# Patient Record
Sex: Male | Born: 1950 | Race: Black or African American | Hispanic: No | Marital: Single | Smoking: Current some day smoker
Health system: Southern US, Community
[De-identification: ages and names within clinical notes are randomized; demographics above are authoritative.]

## PROBLEM LIST (undated history)

## (undated) DIAGNOSIS — D352 Benign neoplasm of pituitary gland: Secondary | ICD-10-CM

## (undated) DIAGNOSIS — I10 Essential (primary) hypertension: Secondary | ICD-10-CM

## (undated) DIAGNOSIS — I639 Cerebral infarction, unspecified: Secondary | ICD-10-CM

---
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
## 1968-06-23 HISTORY — PX: ELBOW SURGERY: SHX618

## 2008-01-09 ENCOUNTER — Emergency Department (HOSPITAL_COMMUNITY): Admission: EM | Admit: 2008-01-09 | Discharge: 2008-01-09 | Payer: Self-pay | Admitting: Emergency Medicine

## 2008-11-26 ENCOUNTER — Emergency Department (HOSPITAL_COMMUNITY): Admission: EM | Admit: 2008-11-26 | Discharge: 2008-11-27 | Payer: Self-pay | Admitting: Emergency Medicine

## 2008-11-30 ENCOUNTER — Encounter: Admission: RE | Admit: 2008-11-30 | Discharge: 2008-11-30 | Payer: Self-pay | Admitting: Orthopaedic Surgery

## 2008-12-18 ENCOUNTER — Emergency Department (HOSPITAL_COMMUNITY): Admission: EM | Admit: 2008-12-18 | Discharge: 2008-12-18 | Payer: Self-pay | Admitting: Family Medicine

## 2011-03-21 LAB — POCT I-STAT, CHEM 8
BUN: 16
Calcium, Ion: 1.01 — ABNORMAL LOW
Chloride: 108
Creatinine, Ser: 1.4
Glucose, Bld: 86
HCT: 43
Hemoglobin: 14.6
Potassium: 4.3
Sodium: 138
TCO2: 23

## 2011-03-21 LAB — CBC
HCT: 39.6
Hemoglobin: 13.3
MCHC: 33.7
MCV: 85
Platelets: 208
RBC: 4.66
RDW: 14
WBC: 8.4

## 2011-03-21 LAB — DIFFERENTIAL
Basophils Absolute: 0
Basophils Relative: 0
Eosinophils Absolute: 0
Eosinophils Relative: 0
Lymphocytes Relative: 12
Lymphs Abs: 1
Monocytes Absolute: 0.5
Monocytes Relative: 6
Neutro Abs: 6.9
Neutrophils Relative %: 82 — ABNORMAL HIGH

## 2011-03-21 LAB — URINE MICROSCOPIC-ADD ON

## 2011-03-21 LAB — URINALYSIS, ROUTINE W REFLEX MICROSCOPIC
Bilirubin Urine: NEGATIVE
Glucose, UA: NEGATIVE
Ketones, ur: 15 — AB
Leukocytes, UA: NEGATIVE
Nitrite: NEGATIVE
Protein, ur: NEGATIVE
Specific Gravity, Urine: 1.025
Urobilinogen, UA: 1
pH: 7

## 2014-05-25 DIAGNOSIS — R531 Weakness: Secondary | ICD-10-CM | POA: Diagnosis not present

## 2014-05-25 DIAGNOSIS — I639 Cerebral infarction, unspecified: Secondary | ICD-10-CM | POA: Diagnosis not present

## 2014-05-25 DIAGNOSIS — I10 Essential (primary) hypertension: Secondary | ICD-10-CM | POA: Diagnosis not present

## 2014-05-25 DIAGNOSIS — I6523 Occlusion and stenosis of bilateral carotid arteries: Secondary | ICD-10-CM | POA: Diagnosis not present

## 2014-05-25 DIAGNOSIS — R200 Anesthesia of skin: Secondary | ICD-10-CM | POA: Diagnosis not present

## 2014-05-25 DIAGNOSIS — I638 Other cerebral infarction: Secondary | ICD-10-CM | POA: Diagnosis not present

## 2014-05-25 DIAGNOSIS — Z720 Tobacco use: Secondary | ICD-10-CM | POA: Diagnosis not present

## 2014-05-25 DIAGNOSIS — R202 Paresthesia of skin: Secondary | ICD-10-CM | POA: Diagnosis not present

## 2014-05-25 LAB — URINALYSIS, COMPLETE
Bacteria: NONE SEEN
Bilirubin,UR: NEGATIVE
Blood: NEGATIVE
Glucose,UR: NEGATIVE mg/dL (ref 0–75)
Ketone: NEGATIVE
Leukocyte Esterase: NEGATIVE
Nitrite: NEGATIVE
Ph: 6 (ref 4.5–8.0)
Protein: NEGATIVE
RBC,UR: 1 /HPF (ref 0–5)
Specific Gravity: 1.023 (ref 1.003–1.030)
Squamous Epithelial: <1
WBC UR: <1 /HPF (ref 0–5)

## 2014-05-25 LAB — COMPREHENSIVE METABOLIC PANEL
Albumin: 3.4 g/dL (ref 3.4–5.0)
Alkaline Phosphatase: 95 U/L
Anion Gap: 7 (ref 7–16)
BUN: 16 mg/dL (ref 7–18)
Bilirubin,Total: 0.5 mg/dL (ref 0.2–1.0)
Calcium, Total: 8.5 mg/dL (ref 8.5–10.1)
Chloride: 108 mmol/L — ABNORMAL HIGH (ref 98–107)
Co2: 28 mmol/L (ref 21–32)
Creatinine: 1.15 mg/dL (ref 0.60–1.30)
EGFR (African American): >60
EGFR (Non-African Amer.): >60
Glucose: 107 mg/dL — ABNORMAL HIGH (ref 65–99)
Osmolality: 287 (ref 275–301)
Potassium: 4.3 mmol/L (ref 3.5–5.1)
SGOT(AST): 21 U/L (ref 15–37)
SGPT (ALT): 14 U/L
Sodium: 143 mmol/L (ref 136–145)
Total Protein: 7 g/dL (ref 6.4–8.2)

## 2014-05-25 LAB — PROTIME-INR
INR: 1
Prothrombin Time: 13.3 secs (ref 11.5–14.7)

## 2014-05-25 LAB — LIPID PANEL
Cholesterol: 158 mg/dL (ref 0–200)
HDL Cholesterol: 37 mg/dL — ABNORMAL LOW (ref 40–60)
Ldl Cholesterol, Calc: 101 mg/dL — ABNORMAL HIGH (ref 0–100)
Triglycerides: 102 mg/dL (ref 0–200)
VLDL Cholesterol, Calc: 20 mg/dL (ref 5–40)

## 2014-05-25 LAB — CBC
HCT: 42.4 % (ref 40.0–52.0)
HGB: 13.6 g/dL (ref 13.0–18.0)
MCH: 27.4 pg (ref 26.0–34.0)
MCHC: 32.2 g/dL (ref 32.0–36.0)
MCV: 85 fL (ref 80–100)
Platelet: 238 10*3/uL (ref 150–440)
RBC: 4.97 10*6/uL (ref 4.40–5.90)
RDW: 14.5 % (ref 11.5–14.5)
WBC: 5.2 10*3/uL (ref 3.8–10.6)

## 2014-05-25 LAB — APTT: Activated PTT: 33.3 secs (ref 23.6–35.9)

## 2014-05-26 ENCOUNTER — Inpatient Hospital Stay: Payer: Self-pay | Admitting: Internal Medicine

## 2014-05-26 DIAGNOSIS — I63541 Cerebral infarction due to unspecified occlusion or stenosis of right cerebellar artery: Secondary | ICD-10-CM | POA: Diagnosis not present

## 2014-05-26 DIAGNOSIS — Z720 Tobacco use: Secondary | ICD-10-CM | POA: Diagnosis not present

## 2014-05-26 DIAGNOSIS — I6782 Cerebral ischemia: Secondary | ICD-10-CM | POA: Diagnosis not present

## 2014-05-26 DIAGNOSIS — I638 Other cerebral infarction: Secondary | ICD-10-CM | POA: Diagnosis not present

## 2014-05-26 DIAGNOSIS — R739 Hyperglycemia, unspecified: Secondary | ICD-10-CM | POA: Diagnosis present

## 2014-05-26 DIAGNOSIS — I63542 Cerebral infarction due to unspecified occlusion or stenosis of left cerebellar artery: Secondary | ICD-10-CM | POA: Diagnosis not present

## 2014-05-26 DIAGNOSIS — F17210 Nicotine dependence, cigarettes, uncomplicated: Secondary | ICD-10-CM | POA: Diagnosis present

## 2014-05-26 DIAGNOSIS — G8194 Hemiplegia, unspecified affecting left nondominant side: Secondary | ICD-10-CM | POA: Diagnosis present

## 2014-05-26 DIAGNOSIS — I639 Cerebral infarction, unspecified: Secondary | ICD-10-CM | POA: Diagnosis present

## 2014-05-26 DIAGNOSIS — Z9114 Patient's other noncompliance with medication regimen: Secondary | ICD-10-CM | POA: Diagnosis present

## 2014-05-26 DIAGNOSIS — Z716 Tobacco abuse counseling: Secondary | ICD-10-CM | POA: Diagnosis not present

## 2014-05-26 DIAGNOSIS — I10 Essential (primary) hypertension: Secondary | ICD-10-CM | POA: Diagnosis not present

## 2014-05-26 DIAGNOSIS — R531 Weakness: Secondary | ICD-10-CM | POA: Diagnosis not present

## 2014-05-26 DIAGNOSIS — E785 Hyperlipidemia, unspecified: Secondary | ICD-10-CM | POA: Diagnosis not present

## 2014-05-26 LAB — HEMOGLOBIN A1C: Hemoglobin A1C: 6.2 % (ref 4.2–6.3)

## 2014-05-27 DIAGNOSIS — I638 Other cerebral infarction: Secondary | ICD-10-CM | POA: Diagnosis not present

## 2014-05-27 DIAGNOSIS — E785 Hyperlipidemia, unspecified: Secondary | ICD-10-CM | POA: Diagnosis not present

## 2014-05-27 DIAGNOSIS — I10 Essential (primary) hypertension: Secondary | ICD-10-CM | POA: Diagnosis not present

## 2014-05-27 DIAGNOSIS — Z716 Tobacco abuse counseling: Secondary | ICD-10-CM | POA: Diagnosis not present

## 2014-05-28 DIAGNOSIS — I638 Other cerebral infarction: Secondary | ICD-10-CM | POA: Diagnosis not present

## 2014-05-28 DIAGNOSIS — E785 Hyperlipidemia, unspecified: Secondary | ICD-10-CM | POA: Diagnosis not present

## 2014-05-28 DIAGNOSIS — I10 Essential (primary) hypertension: Secondary | ICD-10-CM | POA: Diagnosis not present

## 2014-05-28 DIAGNOSIS — Z716 Tobacco abuse counseling: Secondary | ICD-10-CM | POA: Diagnosis not present

## 2014-05-31 DIAGNOSIS — G8194 Hemiplegia, unspecified affecting left nondominant side: Secondary | ICD-10-CM | POA: Diagnosis not present

## 2014-05-31 DIAGNOSIS — I10 Essential (primary) hypertension: Secondary | ICD-10-CM | POA: Diagnosis not present

## 2014-05-31 DIAGNOSIS — E782 Mixed hyperlipidemia: Secondary | ICD-10-CM | POA: Diagnosis not present

## 2014-05-31 DIAGNOSIS — I63411 Cerebral infarction due to embolism of right middle cerebral artery: Secondary | ICD-10-CM | POA: Diagnosis not present

## 2014-07-10 DIAGNOSIS — I10 Essential (primary) hypertension: Secondary | ICD-10-CM | POA: Diagnosis not present

## 2014-07-10 DIAGNOSIS — G8194 Hemiplegia, unspecified affecting left nondominant side: Secondary | ICD-10-CM | POA: Diagnosis not present

## 2014-07-10 DIAGNOSIS — E782 Mixed hyperlipidemia: Secondary | ICD-10-CM | POA: Diagnosis not present

## 2014-07-10 DIAGNOSIS — D497 Neoplasm of unspecified behavior of endocrine glands and other parts of nervous system: Secondary | ICD-10-CM | POA: Diagnosis not present

## 2014-07-11 ENCOUNTER — Other Ambulatory Visit: Payer: Self-pay | Admitting: Internal Medicine

## 2014-07-11 DIAGNOSIS — I10 Essential (primary) hypertension: Secondary | ICD-10-CM

## 2014-07-19 DIAGNOSIS — I69354 Hemiplegia and hemiparesis following cerebral infarction affecting left non-dominant side: Secondary | ICD-10-CM | POA: Diagnosis not present

## 2014-07-19 DIAGNOSIS — R278 Other lack of coordination: Secondary | ICD-10-CM | POA: Diagnosis not present

## 2014-07-19 DIAGNOSIS — I6789 Other cerebrovascular disease: Secondary | ICD-10-CM | POA: Diagnosis not present

## 2014-07-19 DIAGNOSIS — M6281 Muscle weakness (generalized): Secondary | ICD-10-CM | POA: Diagnosis not present

## 2014-07-24 DIAGNOSIS — I6789 Other cerebrovascular disease: Secondary | ICD-10-CM | POA: Diagnosis not present

## 2014-07-24 DIAGNOSIS — I69354 Hemiplegia and hemiparesis following cerebral infarction affecting left non-dominant side: Secondary | ICD-10-CM | POA: Diagnosis not present

## 2014-07-24 DIAGNOSIS — R278 Other lack of coordination: Secondary | ICD-10-CM | POA: Diagnosis not present

## 2014-07-24 DIAGNOSIS — M6281 Muscle weakness (generalized): Secondary | ICD-10-CM | POA: Diagnosis not present

## 2014-07-25 ENCOUNTER — Ambulatory Visit
Admission: RE | Admit: 2014-07-25 | Discharge: 2014-07-25 | Disposition: A | Discharge status: Home / Self Care | Payer: Medicare Other | Source: Ambulatory Visit | Attending: Internal Medicine | Admitting: Internal Medicine

## 2014-07-25 DIAGNOSIS — I10 Essential (primary) hypertension: Secondary | ICD-10-CM

## 2014-07-25 DIAGNOSIS — D352 Benign neoplasm of pituitary gland: Secondary | ICD-10-CM | POA: Diagnosis not present

## 2014-07-25 MED ORDER — GADOBENATE DIMEGLUMINE 529 MG/ML IV SOLN
7.0000 mL | Freq: Once | INTRAVENOUS | Status: AC | PRN
  Administered 2014-07-25: 7 mL via INTRAVENOUS

## 2014-08-10 DIAGNOSIS — G8194 Hemiplegia, unspecified affecting left nondominant side: Secondary | ICD-10-CM | POA: Diagnosis not present

## 2014-08-10 DIAGNOSIS — D497 Neoplasm of unspecified behavior of endocrine glands and other parts of nervous system: Secondary | ICD-10-CM | POA: Diagnosis not present

## 2014-08-10 DIAGNOSIS — E782 Mixed hyperlipidemia: Secondary | ICD-10-CM | POA: Diagnosis not present

## 2014-08-11 DIAGNOSIS — I10 Essential (primary) hypertension: Secondary | ICD-10-CM | POA: Diagnosis not present

## 2014-08-11 DIAGNOSIS — D497 Neoplasm of unspecified behavior of endocrine glands and other parts of nervous system: Secondary | ICD-10-CM | POA: Diagnosis not present

## 2014-08-11 DIAGNOSIS — E237 Disorder of pituitary gland, unspecified: Secondary | ICD-10-CM | POA: Diagnosis not present

## 2014-08-11 DIAGNOSIS — E789 Disorder of lipoprotein metabolism, unspecified: Secondary | ICD-10-CM | POA: Diagnosis not present

## 2014-08-29 DIAGNOSIS — D352 Benign neoplasm of pituitary gland: Secondary | ICD-10-CM | POA: Diagnosis not present

## 2014-09-27 DIAGNOSIS — E237 Disorder of pituitary gland, unspecified: Secondary | ICD-10-CM | POA: Diagnosis not present

## 2014-10-14 NOTE — H&P (Signed)
PATIENT NAME:  MANLY, NESTLE MR#:  [id number] DATE OF BIRTH:  08/01/1950  DATE OF ADMISSION:  05/25/2014  PRIMARY CARE PHYSICIAN: None.  CHIEF COMPLAINT: Left upper and lower extremity weakness for 3 days.   HISTORY OF PRESENT ILLNESS: Mr. Kau is a 64 year old African American gentleman with a past medical history of hypertension, not on any medication since the patient quit taking it on his own, comes to the Emergency Room with left upper and lower extremity weakness for the last 3 days. The patient says he was at work and started noticing that his left hand, he was unable to grip any of the equipment he was working on. He present in the Emergency Room and was found to have malignant hypertension and left upper and lower extremity weakness with possible new CVA. CT head shows old infarct. He is being admitted for further evaluation and management.   During my evaluation, the patient's blood pressure was 207/104.   PAST MEDICAL HISTORY: Hypertension, not taking any medication, quit on his own.   FAMILY HISTORY: Positive for hypertension.   SOCIAL HISTORY: Works at the [employer]. Smokes about a pack a day. Denies alcohol or drug use.   ALLERGIES: No known drug allergies.   REVIEW OF SYSTEMS:  CONSTITUTIONAL: No fever, fatigue, pain, weight loss or weight gain.  EYES: No blurred or double vision. No cataracts or glaucoma.  ENT: No tinnitus, ear pain, postnasal drip.  RESPIRATORY: No cough, wheeze, hemoptysis or dyspnea.  CARDIOVASCULAR: No chest pain, orthopnea, orthopnea, or edema.  GASTROINTESTINAL: No nausea, vomiting, diarrhea, abdominal pain or hematemesis.  GENITOURINARY: No dysuria, hematuria, frequency.  ENDOCRINE: No polyuria, nocturia or thyroid problems.  HEMATOLOGY: No anemia, easy bruising or bleeding.  SKIN: No acne, rash, or lesion.  MUSCULOSKELETAL: No arthritis, cramps, swelling or gout.  NEUROLOGIC: Positive for left upper and lower extremity  weakness, with a weak left hand grip.  PSYCHIATRIC: No anxiety or depression or bipolar disorder.  All other systems reviewed are negative.   PHYSICAL EXAMINATION:  GENERAL: The patient is awake, alert, oriented x 3, not in acute distress.  VITAL SIGNS: Afebrile. Pulse is 61, blood pressure is 207/104, saturation 100% on room air.  HEENT: Atraumatic, normocephalic. Pupils PERLA, EOM intact. Oral mucosa is moist. No facial droop.  NECK: Supple. No JVD. No carotid bruit.  RESPIRATORY: Clear to auscultation bilaterally. No rales, rhonchi, respiratory distress. No labored breathing.  CARDIOVASCULAR: Both the heart sounds are normal. Rate and rhythm are regular. PMI not lateralized. Chest nontender. Good pedal pulses. Good femoral pulses. No lower extremity edema.  ABDOMEN: Soft, benign, nontender. No organomegaly. Positive bowel sounds.  NEUROLOGIC: The patient is right-handed. Cranial appear intact. No facial droop. No nystagmus. The left upper and lower extremity weakness is 3+ to 4/5. The patient has a weak left hand grip. Sensory exam appears normal. Reflexes 1+. Deep tendon jerks are 1+ in both upper and lower extremities. Plantars are downgoing. Gait deferred.  SKIN: Warm and dry.  PSYCHIATRIC: The patient is awake, alert, oriented x 3.   LABORATORY: CBC within normal limits. CT of the head shows extensive periventricular white matter ischemic changes, markedly progressed since 2009. Lacunar infarct in the left thalamus, not acute but new since prior study of 2009. Metabolic panel within normal limits. PT-INR within normal limits.   CHEST X-RAY: No acute cardiopulmonary abnormality.   EKG: Normal sinus rhythm with LVH changes seen on EKG.   ASSESSMENT: Mr. Flemister  is a 64 year old with  history of hypertension, noncompliant with medication, comes in with left-sided weakness, upper and lower extremities, for 2-3 days. He is being admitted with:  1.  Suspected acute cerebrovascular accident.  The patient has significant left hand grip which is weak. CT head does not show acute stroke, but new stroke since 2009. He is going to be admitted on the telemetry floor. We will start him on full dose aspirin. Dr. Leotis Pain  from neurology has seen the patient; will follow Dr. Beryl Meager recommendations. Will get MRI of the brain, echo Doppler, and ultrasound carotid Doppler. Check lipid profile.  2.  Malignant hypertension. I will allow some permissive hypertension in the setting of the patient's symptoms suggestive of stroke. We will give p.r.n. hydralazine if systolic is greater than 811. Start him on low-dose beta blocker, 25 mg b.i.d. The patient is advised on a low sodium diet as well.  3.  Tobacco abuse. The patient was advised on smoking cessation; about 4 minutes spent. He did voice understanding.   The above was discussed with the patient and the patient's wife, who was present in the Emergency Room.   TIME SPENT: 50 minutes.    ____________________________ Hart Rochester Posey Pronto, MD sap:MT D: 05/25/2014 16:25:08 ET T: 05/25/2014 16:46:00 ET JOB#: [id number]  cc: Alric Geise A. Posey Pronto, MD, <Dictator> Ilda Basset MD ELECTRONICALLY SIGNED 06/16/2014 17:00

## 2014-10-14 NOTE — Consult Note (Signed)
PATIENT NAME:  Robert Mckay, Robert Mckay MR#:  [id number] DATE OF BIRTH:  Aug 25, 1950  DATE OF CONSULTATION:  05/25/2014  REFERRING PHYSICIAN:   CONSULTING PHYSICIAN:  Leotis Pain, MD  REASON FOR CONSULTATION: Weakness in the left upper and left lower extremities.   HISTORY OF PRESENT ILLNESS: This is a 64 year old male with a past medical history of hypertension, who was on lisinopril that he stopped on his own, presenting with a 2 day history of clumsiness in the left upper extremity and the left lower extremity. The patient states that, on Tuesday evening, he started having difficulty with his hand grip on the left hand, as well as noticed to have increased difficulty ambulating with weakness in the left lower extremity. The patient thought it would go away. He went to work. 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 That is the reason for coming to [facility].   SOCIAL HISTORY: The patient denies any EtOH drinking. He denies any drug use, positive for at least 1/2 pack per day smoking.   PAST MEDICAL HISTORY: Hypertension.   FAMILY HISTORY: Noncontributory.   LABORATORY WORK UP: Pending.   IMAGING: CAT scan of the head has not been done; and is pending as well.   NEUROLOGIC EVALUATION:  MENTAL STATUS: Alert, awake, oriented to time, place, and location.  CRANIAL NERVES: Facial sensation intact. Facial motor is intact. Tongue is midline. Uvula elevates symmetrically. Shoulder shrug intact.  MOTOR: Strength is 5/5 bilaterally, upper extremities in the proximal strength. In the distal left upper extremity, the patient has a definite weakness in left hand grip. Left lower extremity is 4+/5. Right lower extremity is 5/5.  SENSATION: Intact bilaterally.   IMPRESSION: A 64 year old male with past medical history of hypertension, not taking lisinopril at this time, presenting with a 2-day history of  weakness in left hand grip as well as weakness in the left lower extremity. Suspect right-sided subcortical small infarct. The patient is going for a CAT scan now.   PLAN: Agree with CAT scan. If no signs of hemorrhage, the patient should be started on antiplatelet therapy. The patient was not on this at home. Start aspirin 325. Start statin. A long-term discussion in terms of smoking cessation. Agree with stroke work-up including MRI of the brain, 2-D echo, carotid Dopplers. Physical therapy, occupational therapy. Will likely discharge in the next day.   This case was discussed with patient and patient's significant other at bedside.   Thank you. It was a pleasure seeing this patient.    ____________________________ Leotis Pain, MD yz:MT D: 05/25/2014 15:20:42 ET T: 05/25/2014 15:34:05 ET JOB#: [id number]  cc: Leotis Pain, MD, <Dictator> Leotis Pain MD ELECTRONICALLY SIGNED 06/21/2014 13:48

## 2014-10-14 NOTE — Discharge Summary (Signed)
PATIENT NAME:  Robert Mckay, Robert Mckay MR#:  [id number] DATE OF BIRTH:  01-29-1951  DATE OF ADMISSION:  05/26/2014 DATE OF DISCHARGE:  05/28/2014  CHIEF COMPLAINT AT THE TIME OF ADMISSION: Left upper and lower extremity weakness for 3 days.   ADMITTING DIAGNOSES:  1.  Suspected acute cerebrovascular accident.  2.  Malignant hypertension.  3.  Tobacco abuse.   DISCHARGE DIAGNOSES:  1.  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  2.  Malignant essential hypertension.  3.  Hyperlipidemia.  4.  Hyperglycemia.  5.  Tobacco abuse.   CONSULTATIONS: Neurology and physical therapy.   PROCEDURES: None.  BRIEF HISTORY AND PHYSICAL AND HOSPITAL COURSE: The patient is a 64 year old African American male who came into the ED with a chief complaint of left upper and lower extremity weakness for 3 days. Please review history and physical for details. The patient's initial blood pressure was very high. CT, head, has revealed old infarct.   HOSPITAL COURSE:  1.  Acute CVA. Initial CAT scan of the head did not show any acute stroke. The patient was admitted to the hospital, as he has left-sided weakness. He was placed on full-dose aspirin and neurology was consulted. MRA of the brain, carotid Dopplers, and 2-D echocardiogram were ordered. MRA of the brain has revealed infarct in the right centrum semiovale. Neurology, Dr. Irish Elders, has recommended to continue aspirin and Lipitor. He also has recommended physical therapy and occupational therapy. The patient's left upper extremity weakness is greater than left lower extremity weakness. Denies any speech problems, dysphasia, or blurry vision. The patient is evaluated by physical therapy and occupational therapy, who has recommended outpatient occupational therapy. The patient is agreeable for that. Patient was instructed not to drive until seen and cleared by primary care physician or neurologist. The patient  verbalized understanding.  2.  Malignant essential hypertension. The patient is supposed to take blood pressure medications, but he stopped taking them. We have reinforced the importance of compliance with his medications and not to stop them. The patient is placed on Norvasc, with goal systolic blood pressure 836 and eventually to lower further. At this point, we did not titrate his medications too much, as the patient has acute stroke. The plan is to allow permissive hypertension.  3.  Hyperlipidemia. LDL is at 101. The patient is started on Lipitor. The plan is to continue Lipitor.  4.  Hyperglycemia. Hemoglobin A1c is 6.2. Rule out diabetes mellitus.  5.  Tobacco abuse. Counseled the patient to quit smoking. The patient prefers getting nicotine inhaler, which is over-the-counter.  Otherwise, hospital course was uneventful. Case management was consulted regarding outpatient occupational therapy arrangements.   VITAL SIGNS: On December 6, temperature afebrile. Pulse 51 to 74, respirations 18, blood pressure systolic 629 to 476, diastolic 72 to 85, pulse oximetry 96% to 99% on room air.  LABORATORY AND IMAGING STUDIES: Echocardiogram has revealed left ventricular ejection fraction 70% to 75%, normal global left ventricular systolic function, mildly increased left ventricular posterior wall thickness, moderate concentric left ventricular hypertrophy. CAT scan of the head without contrast: Extensive periventricular white matter ischemic changes, markedly progressed since 2009. Lacunar infarct in the left thalamus, not acute but new since prior study. MRA of the brain: This study was performed on 05/26/2014. 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 Brain MRA without  and with contrast could be performed for more detailed evaluation.  Carotid Dopplers: Mild heterogenous atherosclerotic plaque on the right without evidence of stenosis. Moderate hydrogenous relatively smooth atherosclerotic plaque on the left without evidence of stenosis. Vertebral arteries are patent, with normal antegrade flow. Glucose 107. BUN and creatinine are normal. Sodium and potassium are normal. Serum osmolality and calcium are normal. Total cholesterol 158, triglycerides 102, HDL 37, hemoglobin A1c 6.2. LFTs are normal. CBC is normal, PT, INR are normal. Urinalysis: Nitrites and leukocyte esterase are negative.   MEDICATIONS AT THE TIME OF DISCHARGE: Aspirin 325 mg 1 tablet p.o. once daily, nicotine patch transdermally once daily, amlodipine 10 mg once daily, atorvastatin 40 mg once daily, metoprolol tartrate 50 mg p.o. 2 times a day.   DIET: Low sodium, low fat, regular consistency.  ACTIVITY: As tolerated. No driving until cleared by primary care physician or neurology.  FOLLOWUP: Follow up with PCP in 5 days, neurology in 2 to 4 weeks. Advised to quit smoking.  CODE STATUS: Full code.  Plan of care was discussed in detail with the patient. He has verbalized understanding of the plan.  TOTAL TIME SPENT ON DISCHARGE: 45 minutes.   ____________________________ Nicholes Mango, MD ag:ST D: 06/04/2014 21:07:21 ET T: 06/04/2014 22:01:23 ET JOB#: [id number]  cc: Nicholes Mango, MD, <Dictator> Primary Care Physician Neurologist  Nicholes Mango MD ELECTRONICALLY SIGNED 06/08/2014 16:59

## 2014-10-27 DIAGNOSIS — G8194 Hemiplegia, unspecified affecting left nondominant side: Secondary | ICD-10-CM | POA: Diagnosis not present

## 2014-10-27 DIAGNOSIS — D497 Neoplasm of unspecified behavior of endocrine glands and other parts of nervous system: Secondary | ICD-10-CM | POA: Diagnosis not present

## 2014-10-27 DIAGNOSIS — E782 Mixed hyperlipidemia: Secondary | ICD-10-CM | POA: Diagnosis not present

## 2014-11-02 DIAGNOSIS — I63411 Cerebral infarction due to embolism of right middle cerebral artery: Secondary | ICD-10-CM | POA: Diagnosis not present

## 2014-11-02 DIAGNOSIS — E782 Mixed hyperlipidemia: Secondary | ICD-10-CM | POA: Diagnosis not present

## 2014-11-02 DIAGNOSIS — I10 Essential (primary) hypertension: Secondary | ICD-10-CM | POA: Diagnosis not present

## 2014-11-02 DIAGNOSIS — D497 Neoplasm of unspecified behavior of endocrine glands and other parts of nervous system: Secondary | ICD-10-CM | POA: Diagnosis not present

## 2015-01-30 ENCOUNTER — Other Ambulatory Visit: Payer: Self-pay | Admitting: Endocrinology

## 2015-01-30 DIAGNOSIS — D352 Benign neoplasm of pituitary gland: Secondary | ICD-10-CM

## 2015-02-19 DIAGNOSIS — I10 Essential (primary) hypertension: Secondary | ICD-10-CM | POA: Diagnosis not present

## 2015-02-19 DIAGNOSIS — E782 Mixed hyperlipidemia: Secondary | ICD-10-CM | POA: Diagnosis not present

## 2016-12-25 DIAGNOSIS — I160 Hypertensive urgency: Secondary | ICD-10-CM | POA: Diagnosis not present

## 2016-12-25 DIAGNOSIS — R9431 Abnormal electrocardiogram [ECG] [EKG]: Secondary | ICD-10-CM | POA: Diagnosis not present

## 2016-12-25 DIAGNOSIS — I10 Essential (primary) hypertension: Secondary | ICD-10-CM | POA: Diagnosis not present

## 2016-12-25 DIAGNOSIS — J40 Bronchitis, not specified as acute or chronic: Secondary | ICD-10-CM | POA: Diagnosis not present

## 2018-01-19 ENCOUNTER — Emergency Department (HOSPITAL_COMMUNITY)
Admission: EM | Admit: 2018-01-19 | Discharge: 2018-01-19 | Disposition: A | Discharge status: Home / Self Care | Payer: Medicare Other | Attending: Emergency Medicine | Admitting: Emergency Medicine

## 2018-01-19 ENCOUNTER — Other Ambulatory Visit: Payer: Self-pay

## 2018-01-19 ENCOUNTER — Encounter (HOSPITAL_COMMUNITY): Payer: Self-pay

## 2018-01-19 DIAGNOSIS — I10 Essential (primary) hypertension: Secondary | ICD-10-CM | POA: Insufficient documentation

## 2018-01-19 DIAGNOSIS — F17210 Nicotine dependence, cigarettes, uncomplicated: Secondary | ICD-10-CM | POA: Insufficient documentation

## 2018-01-19 DIAGNOSIS — Z8673 Personal history of transient ischemic attack (TIA), and cerebral infarction without residual deficits: Secondary | ICD-10-CM | POA: Insufficient documentation

## 2018-01-19 DIAGNOSIS — M6281 Muscle weakness (generalized): Secondary | ICD-10-CM | POA: Insufficient documentation

## 2018-01-19 DIAGNOSIS — R531 Weakness: Secondary | ICD-10-CM

## 2018-01-19 HISTORY — DX: Cerebral infarction, unspecified: I63.9

## 2018-01-19 HISTORY — DX: Essential (primary) hypertension: I10

## 2018-01-19 LAB — COMPREHENSIVE METABOLIC PANEL
ALT: 9 U/L (ref 0–44)
AST: 15 U/L (ref 15–41)
Albumin: 3.7 g/dL (ref 3.5–5.0)
Alkaline Phosphatase: 80 U/L (ref 38–126)
Anion gap: 6 (ref 5–15)
BUN: 10 mg/dL (ref 8–23)
CO2: 28 mmol/L (ref 22–32)
Calcium: 9.3 mg/dL (ref 8.9–10.3)
Chloride: 107 mmol/L (ref 98–111)
Creatinine, Ser: 1.18 mg/dL (ref 0.61–1.24)
GFR calc Af Amer: >60 mL/min (ref >60)
GFR calc non Af Amer: >60 mL/min (ref >60)
Glucose, Bld: 115 mg/dL — ABNORMAL HIGH (ref 70–99)
Potassium: 3.9 mmol/L (ref 3.5–5.1)
Sodium: 141 mmol/L (ref 135–145)
Total Bilirubin: 0.8 mg/dL (ref 0.3–1.2)
Total Protein: 7 g/dL (ref 6.5–8.1)

## 2018-01-19 LAB — CBC
HCT: 43.3 % (ref 39.0–52.0)
Hemoglobin: 13.4 g/dL (ref 13.0–17.0)
MCH: 26.7 pg (ref 26.0–34.0)
MCHC: 30.9 g/dL (ref 30.0–36.0)
MCV: 86.4 fL (ref 78.0–100.0)
Platelets: 221 10*3/uL (ref 150–400)
RBC: 5.01 MIL/uL (ref 4.22–5.81)
RDW: 14.1 % (ref 11.5–15.5)
WBC: 4.5 10*3/uL (ref 4.0–10.5)

## 2018-01-19 NOTE — ED Provider Notes (Signed)
[place] EMERGENCY DEPARTMENT Provider Note   CSN: [id number] Arrival date & time: 01/19/18  1314     History   Chief Complaint Chief Complaint  Patient presents with  . Weakness    HPI Robert Mckay is a 67 y.o. male.  HPI 67 year old male with a history of hypertension stroke who presents to the emergency department with complaints of generalized weakness.  Patient reports that his generalized weakness is been present for 3 or 4 years his wife became concerned today because he stated that he just felt weak today.  He denies unilateral arm or leg weakness.  No change in speech.  No headaches.  No chest pain or shortness of breath.  No abdominal pain.  Denies nausea vomiting diarrhea.  No recent blood in his stool.  No fevers.  Patient states he just feels weak and has no other associated symptoms and cannot describe this feeling any further.  His wife is concerned because she is feels like his appetite is decreased over the past week.  Patient feels as though his appetite is normal   Past Medical History:  Diagnosis Date  . Hypertension   . Stroke [facility])     There are no active problems to display for this patient.   History reviewed. No pertinent surgical history.      Home Medications    Prior to Admission medications   Medication Sig Start Date End Date Taking? Authorizing Provider  amLODipine (NORVASC) 5 MG tablet Take 5 mg by mouth daily as needed (high blood pressure).  11/13/17  Yes [provider]    Family History History reviewed. No pertinent family history.  Social History Social History   Tobacco Use  . Smoking status: Current Some Day Smoker  . Smokeless tobacco: Never Used  Substance Use Topics  . Alcohol use: Not Currently  . Drug use: Not on file     Allergies   Patient has no known allergies.   Review of Systems Review of Systems  All other systems reviewed and are negative.    Physical  Exam Updated Vital Signs BP (!) 196/110 (BP Location: Right Arm)   Pulse 70   Temp 98.2 F (36.8 C) (Oral)   Resp 18   SpO2 100%   Physical Exam  Constitutional: He is oriented to person, place, and time. He appears well-developed and well-nourished.  HENT:  Head: Normocephalic and atraumatic.  Eyes: Pupils are equal, round, and reactive to light. EOM are normal.  Neck: Normal range of motion.  Cardiovascular: Normal rate, regular rhythm, normal heart sounds and intact distal pulses.  Pulmonary/Chest: Effort normal and breath sounds normal. No respiratory distress.  Abdominal: Soft. He exhibits no distension. There is no tenderness.  Musculoskeletal: Normal range of motion.  Neurological: He is alert and oriented to person, place, and time.  5/5 strength in major muscle groups of  bilateral upper and lower extremities. Speech normal. No facial asymetry.  Quick normal gait throughout the emergency department  Skin: Skin is warm and dry.  Psychiatric: He has a normal mood and affect. Judgment normal.  Nursing note and vitals reviewed.    ED Treatments / Results  Labs (all labs ordered are listed, but only abnormal results are displayed) Labs Reviewed  COMPREHENSIVE METABOLIC PANEL - Abnormal; Notable for the following components:      Result Value   Glucose, Bld 115 (*)    All other components within normal limits  CBC  URINALYSIS, ROUTINE  W REFLEX MICROSCOPIC  CBG MONITORING, ED    EKG None  Radiology No results found.  Procedures Procedures (including critical care time)  Medications Ordered in ED Medications - No data to display   Initial Impression / Assessment and Plan / ED Course  I have reviewed the triage vital signs and the nursing notes.  Pertinent labs & imaging results that were available during my care of the patient were reviewed by me and considered in my medical decision making (see chart for details).     Labs in the emergency department  without significant abnormality.  There is a bit of a complex history to obtain as the patient only with state that he was "feeling weak" and had no other associated symptoms and was unable to further describe the symptoms in more detail.  He became somewhat frustrated as I continued with in-depth questioning.  No clear etiology found albeit difficult history.  Vital signs are normal.  Gait is normal.  Basic labs are normal in the emergency department.  Nonfocal neurologic exam.  Recommended the patient and the patient's spouse is to closely follow-up with the primary care physician  Final Clinical Impressions(s) / ED Diagnoses   Final diagnoses:  Weakness    ED Discharge Orders    None       Jola Schmidt, MD 01/19/18 [phone]

## 2018-01-19 NOTE — ED Triage Notes (Signed)
Pt states he has been feeling weak X2 days. Pt denies any sickness n/v/d, etc. Pt states he had a stroke in 2015. Pt states he has some residual left side weakness. Pt is aoX4.

## 2018-01-19 NOTE — ED Notes (Signed)
Called pt x3 for room, no response. 

## 2018-01-19 NOTE — Discharge Instructions (Addendum)
Please follow up with your primary care physician.

## 2018-01-19 NOTE — ED Notes (Addendum)
pk to discharge per dr camppos with bp 258/102 and to take BP medication when he gets home

## 2018-01-19 NOTE — ED Provider Notes (Signed)
Patient placed in Quick Look pathway, seen and evaluated   Chief Complaint: generalized weakness  HPI:   Pt states for the pat 2 days he has had increased weakness/tiredness/ "doesn't feel good." no other sxs. Denies fevers, chills, CP, SOB, N/V, and pain, urian sxs, abnormal BMs. H/o HTN for which he occasionally takes medication (none today or yesterday). H/o CVA 2015 with residual L side deficits. Smokes cigarettes, no etoh or drugs. No medical changes. Pt reports decreased appetite over the past several weeks, no weight changes.   ROS: weakness  Physical Exam:   Gen: No distress  Neuro: Awake and Alert  Skin: Warm    Focused Exam: RRR and CTAB. No TTP of abd. No leg swelling. Good pedal pulses.    Initiation of care has begun. The patient has been counseled on the process, plan, and necessity for staying for the completion/evaluation, and the remainder of the medical screening examination    Franchot Heidelberg, PA-C 01/19/18 1334    Tegeler, Gwenyth Allegra, MD 01/19/18 [phone]

## 2018-03-25 ENCOUNTER — Other Ambulatory Visit: Payer: Self-pay

## 2018-03-25 ENCOUNTER — Encounter (HOSPITAL_COMMUNITY): Payer: Self-pay

## 2018-03-25 ENCOUNTER — Emergency Department (HOSPITAL_COMMUNITY)
Admission: EM | Admit: 2018-03-25 | Discharge: 2018-03-25 | Disposition: A | Discharge status: Home / Self Care | Payer: Medicare Other | Attending: Emergency Medicine | Admitting: Emergency Medicine

## 2018-03-25 ENCOUNTER — Emergency Department (HOSPITAL_COMMUNITY): Payer: Medicare Other

## 2018-03-25 DIAGNOSIS — Z8673 Personal history of transient ischemic attack (TIA), and cerebral infarction without residual deficits: Secondary | ICD-10-CM | POA: Diagnosis not present

## 2018-03-25 DIAGNOSIS — I10 Essential (primary) hypertension: Secondary | ICD-10-CM | POA: Insufficient documentation

## 2018-03-25 DIAGNOSIS — F17200 Nicotine dependence, unspecified, uncomplicated: Secondary | ICD-10-CM | POA: Insufficient documentation

## 2018-03-25 DIAGNOSIS — R4781 Slurred speech: Secondary | ICD-10-CM | POA: Diagnosis not present

## 2018-03-25 DIAGNOSIS — Z79899 Other long term (current) drug therapy: Secondary | ICD-10-CM | POA: Insufficient documentation

## 2018-03-25 DIAGNOSIS — G510 Bell's palsy: Secondary | ICD-10-CM

## 2018-03-25 DIAGNOSIS — R29810 Facial weakness: Secondary | ICD-10-CM | POA: Diagnosis present

## 2018-03-25 LAB — DIFFERENTIAL
Abs Immature Granulocytes: 0 10*3/uL (ref 0.0–0.1)
Basophils Absolute: 0 10*3/uL (ref 0.0–0.1)
Basophils Relative: 1 %
Eosinophils Absolute: 0.2 10*3/uL (ref 0.0–0.7)
Eosinophils Relative: 4 %
Immature Granulocytes: 0 %
Lymphocytes Relative: 39 %
Lymphs Abs: 1.7 10*3/uL (ref 0.7–4.0)
Monocytes Absolute: 0.5 10*3/uL (ref 0.1–1.0)
Monocytes Relative: 10 %
Neutro Abs: 2 10*3/uL (ref 1.7–7.7)
Neutrophils Relative %: 46 %

## 2018-03-25 LAB — COMPREHENSIVE METABOLIC PANEL
ALT: 10 U/L (ref 0–44)
AST: 19 U/L (ref 15–41)
Albumin: 3.9 g/dL (ref 3.5–5.0)
Alkaline Phosphatase: 80 U/L (ref 38–126)
Anion gap: 7 (ref 5–15)
BUN: 21 mg/dL (ref 8–23)
CO2: 28 mmol/L (ref 22–32)
Calcium: 9.7 mg/dL (ref 8.9–10.3)
Chloride: 105 mmol/L (ref 98–111)
Creatinine, Ser: 1.24 mg/dL (ref 0.61–1.24)
GFR calc Af Amer: >60 mL/min (ref >60)
GFR calc non Af Amer: 58 mL/min — ABNORMAL LOW (ref >60)
Glucose, Bld: 135 mg/dL — ABNORMAL HIGH (ref 70–99)
Potassium: 3.9 mmol/L (ref 3.5–5.1)
Sodium: 140 mmol/L (ref 135–145)
Total Bilirubin: 0.7 mg/dL (ref 0.3–1.2)
Total Protein: 6.6 g/dL (ref 6.5–8.1)

## 2018-03-25 LAB — PROTIME-INR
INR: 1.04
Prothrombin Time: 13.6 seconds (ref 11.4–15.2)

## 2018-03-25 LAB — I-STAT CHEM 8, ED
BUN: 24 mg/dL — ABNORMAL HIGH (ref 8–23)
Calcium, Ion: 1.16 mmol/L (ref 1.15–1.40)
Chloride: 103 mmol/L (ref 98–111)
Creatinine, Ser: 1.3 mg/dL — ABNORMAL HIGH (ref 0.61–1.24)
Glucose, Bld: 130 mg/dL — ABNORMAL HIGH (ref 70–99)
HCT: 38 % — ABNORMAL LOW (ref 39.0–52.0)
Hemoglobin: 12.9 g/dL — ABNORMAL LOW (ref 13.0–17.0)
Potassium: 3.7 mmol/L (ref 3.5–5.1)
Sodium: 141 mmol/L (ref 135–145)
TCO2: 27 mmol/L (ref 22–32)

## 2018-03-25 LAB — CBC
HCT: 39.4 % (ref 39.0–52.0)
Hemoglobin: 12.4 g/dL — ABNORMAL LOW (ref 13.0–17.0)
MCH: 27.7 pg (ref 26.0–34.0)
MCHC: 31.5 g/dL (ref 30.0–36.0)
MCV: 87.9 fL (ref 78.0–100.0)
Platelets: 227 10*3/uL (ref 150–400)
RBC: 4.48 MIL/uL (ref 4.22–5.81)
RDW: 14.6 % (ref 11.5–15.5)
WBC: 4.4 10*3/uL (ref 4.0–10.5)

## 2018-03-25 LAB — APTT: aPTT: 32 seconds (ref 24–36)

## 2018-03-25 LAB — I-STAT TROPONIN, ED: Troponin i, poc: 0 ng/mL (ref 0.00–0.08)

## 2018-03-25 MED ORDER — VALACYCLOVIR HCL 1 G PO TABS: 1000.0000 mg | ORAL_TABLET | Freq: Three times a day (TID) | ORAL | 0 refills | Status: AC

## 2018-03-25 MED ORDER — PREDNISONE 10 MG (21) PO TBPK: 10.0000 mg | ORAL_TABLET | Freq: Every day | ORAL | 0 refills | Status: DC

## 2018-03-25 NOTE — ED Provider Notes (Signed)
[place] EMERGENCY DEPARTMENT Provider Note   CSN: [id number] Arrival date & time: 03/25/18  1400     History   Chief Complaint Chief Complaint  Patient presents with  . Stroke Symptoms    HPI Robert Mckay is a 67 y.o. male.  Pt presents to the ED today with right sided facial droop.  Pt's family member noticed it 2 days ago.  Pt denies any difficulty swallowing or moving.  No weakness.  No numbness.       Past Medical History:  Diagnosis Date  . Hypertension   . Stroke [facility])     There are no active problems to display for this patient.   History reviewed. No pertinent surgical history.      Home Medications    Prior to Admission medications   Medication Sig Start Date End Date Taking? Authorizing Provider  amLODipine (NORVASC) 5 MG tablet Take 5 mg by mouth daily as needed (high blood pressure).  11/13/17   [provider]  predniSONE (STERAPRED UNI-PAK 21 TAB) 10 MG (21) TBPK tablet Take 1 tablet (10 mg total) by mouth daily. Take 6 tabs by mouth daily  for 2 days, then 5 tabs for 2 days, then 4 tabs for 2 days, then 3 tabs for 2 days, 2 tabs for 2 days, then 1 tab by mouth daily for 2 days 03/25/18   Isla Pence, MD  valACYclovir (VALTREX) 1000 MG tablet Take 1 tablet (1,000 mg total) by mouth 3 (three) times daily for 7 days. 03/25/18 04/01/18  Isla Pence, MD    Family History No family history on file.  Social History Social History   Tobacco Use  . Smoking status: Current Some Day Smoker  . Smokeless tobacco: Never Used  Substance Use Topics  . Alcohol use: Not Currently  . Drug use: Not on file     Allergies   Patient has no known allergies.   Review of Systems Review of Systems  Neurological:       Right facial droop     Physical Exam Updated Vital Signs BP (!) 178/86 (BP Location: Right Arm)   Pulse (!) 55   Temp 98.7 F (37.1 C) (Oral)   Resp 16   SpO2 100%   Physical Exam    Constitutional: He is oriented to person, place, and time. He appears well-developed and well-nourished.  HENT:  Head: Atraumatic.  Right Ear: External ear normal.  Left Ear: External ear normal.  Nose: Nose normal.  Mouth/Throat: Oropharynx is clear and moist.  Eyes: Pupils are equal, round, and reactive to light. Conjunctivae and EOM are normal.  Neck: Normal range of motion. Neck supple.  Cardiovascular: Normal rate, regular rhythm, normal heart sounds and intact distal pulses.  Pulmonary/Chest: Effort normal and breath sounds normal.  Abdominal: Soft. Bowel sounds are normal.  Musculoskeletal: Normal range of motion.  Neurological: He is alert and oriented to person, place, and time.  Right sided facial droop.  Forehead muscles partially spared.  Skin: Skin is warm. Capillary refill takes less than 2 seconds.  Psychiatric: He has a normal mood and affect. His behavior is normal. Judgment and thought content normal.  Nursing note and vitals reviewed.    ED Treatments / Results  Labs (all labs ordered are listed, but only abnormal results are displayed) Labs Reviewed  CBC - Abnormal; Notable for the following components:      Result Value   Hemoglobin 12.4 (*)    All  other components within normal limits  COMPREHENSIVE METABOLIC PANEL - Abnormal; Notable for the following components:   Glucose, Bld 135 (*)    GFR calc non Af Amer 58 (*)    All other components within normal limits  I-STAT CHEM 8, ED - Abnormal; Notable for the following components:   BUN 24 (*)    Creatinine, Ser 1.30 (*)    Glucose, Bld 130 (*)    Hemoglobin 12.9 (*)    HCT 38.0 (*)    All other components within normal limits  PROTIME-INR  APTT  DIFFERENTIAL  I-STAT TROPONIN, ED    EKG EKG Interpretation  Date/Time:  Thursday March 25 2018 14:15:08 EDT Ventricular Rate:  71 PR Interval:  138 QRS Duration: 106 QT Interval:  384 QTC Calculation: 417 R Axis:   82 Text Interpretation:   Normal sinus rhythm Right atrial enlargement Left ventricular hypertrophy with repolarization abnormality Anteroseptal infarct , age undetermined Abnormal ECG No significant change since last tracing Confirmed by Isla Pence [id number]) on 03/25/2018 5:08:28 PM   Radiology Ct Head Wo Contrast  Result Date: 03/25/2018 CLINICAL DATA:  Right facial droop and slurred speech for the past 2 days. Known pituitary macro adenoma. EXAM: CT HEAD WITHOUT CONTRAST TECHNIQUE: Contiguous axial images were obtained from the base of the skull through the vertex without intravenous contrast. COMPARISON:  Brain MR dated 07/25/2014 and head CT dated 05/25/2014. FINDINGS: Brain: Mildly progressive diffuse enlargement of the ventricles and cortical sulci and patchy white matter low density in both cerebral hemispheres. 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 No significant change in the previously demonstrated pituitary mass. No intracranial hemorrhage, mass lesion or CT evidence of acute infarction. Vascular: No hyperdense vessel or unexpected calcification. Skull: Normal. Negative for fracture or focal lesion.  This Sinuses/Orbits: No acute finding. Other: None. IMPRESSION: 1. No acute abnormality. 2. Mildly progressive atrophy and chronic small vessel white matter ischemic changes. 3. 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 Electronically Signed   By: Claudie Revering M.D.   On: 03/25/2018 15:38    Procedures Procedures (including critical care time)  Medications Ordered in ED Medications - No data to display   Initial Impression / Assessment and Plan / ED Course  I have reviewed the triage vital signs and the nursing notes.  Pertinent labs & imaging results that were available during my care of the patient were reviewed by me and considered in my medical decision making (see chart for details).    Pt's sx c/w Bell's Palsy.  He has eye drops at home and is told to tape his eyelid shut  if it does not shut all the way.  He knows to return if worse and to f/u with pcp.  Final Clinical Impressions(s) / ED Diagnoses   Final diagnoses:  Bell's palsy    ED Discharge Orders         Ordered    predniSONE (STERAPRED UNI-PAK 21 TAB) 10 MG (21) TBPK tablet  Daily     03/25/18 1716    valACYclovir (VALTREX) 1000 MG tablet  3 times daily     03/25/18 1716           Isla Pence, MD 03/25/18 1756

## 2018-03-25 NOTE — ED Triage Notes (Signed)
Pt presents for evaluation of R sided facial droop and slurred speech x 2 days. Wife states she noticed the facial droop first, progressed to slurred speech that she noticed while talking to the patient on the phone yesterday. No arm or leg weakness, no visual changes. Pt denies pain or headache.

## 2018-11-24 DIAGNOSIS — Z125 Encounter for screening for malignant neoplasm of prostate: Secondary | ICD-10-CM | POA: Diagnosis not present

## 2018-11-24 DIAGNOSIS — R42 Dizziness and giddiness: Secondary | ICD-10-CM | POA: Diagnosis not present

## 2018-11-24 DIAGNOSIS — Z7189 Other specified counseling: Secondary | ICD-10-CM | POA: Diagnosis not present

## 2018-11-24 DIAGNOSIS — R5383 Other fatigue: Secondary | ICD-10-CM | POA: Diagnosis not present

## 2018-11-24 DIAGNOSIS — E441 Mild protein-calorie malnutrition: Secondary | ICD-10-CM | POA: Diagnosis not present

## 2018-11-24 DIAGNOSIS — R0602 Shortness of breath: Secondary | ICD-10-CM | POA: Diagnosis not present

## 2018-11-24 DIAGNOSIS — R634 Abnormal weight loss: Secondary | ICD-10-CM | POA: Diagnosis not present

## 2018-11-25 DIAGNOSIS — R0602 Shortness of breath: Secondary | ICD-10-CM | POA: Diagnosis not present

## 2018-11-25 DIAGNOSIS — R42 Dizziness and giddiness: Secondary | ICD-10-CM | POA: Diagnosis not present

## 2018-11-29 ENCOUNTER — Ambulatory Visit (HOSPITAL_COMMUNITY)
Admission: EM | Admit: 2018-11-29 | Discharge: 2018-11-29 | Disposition: A | Discharge status: Home / Self Care | Payer: Medicare HMO

## 2018-11-29 NOTE — ED Notes (Signed)
Pt was seen by PCP already and had a complete work up done; will wait for results

## 2018-12-01 DIAGNOSIS — I10 Essential (primary) hypertension: Secondary | ICD-10-CM | POA: Diagnosis not present

## 2018-12-01 DIAGNOSIS — D497 Neoplasm of unspecified behavior of endocrine glands and other parts of nervous system: Secondary | ICD-10-CM | POA: Diagnosis not present

## 2018-12-01 DIAGNOSIS — R634 Abnormal weight loss: Secondary | ICD-10-CM | POA: Diagnosis not present

## 2018-12-01 DIAGNOSIS — L989 Disorder of the skin and subcutaneous tissue, unspecified: Secondary | ICD-10-CM | POA: Diagnosis not present

## 2018-12-01 DIAGNOSIS — I63411 Cerebral infarction due to embolism of right middle cerebral artery: Secondary | ICD-10-CM | POA: Diagnosis not present

## 2018-12-01 DIAGNOSIS — G8194 Hemiplegia, unspecified affecting left nondominant side: Secondary | ICD-10-CM | POA: Diagnosis not present

## 2018-12-08 DIAGNOSIS — E782 Mixed hyperlipidemia: Secondary | ICD-10-CM | POA: Diagnosis not present

## 2018-12-08 DIAGNOSIS — R634 Abnormal weight loss: Secondary | ICD-10-CM | POA: Diagnosis not present

## 2018-12-08 DIAGNOSIS — E441 Mild protein-calorie malnutrition: Secondary | ICD-10-CM | POA: Diagnosis not present

## 2018-12-08 DIAGNOSIS — I63411 Cerebral infarction due to embolism of right middle cerebral artery: Secondary | ICD-10-CM | POA: Diagnosis not present

## 2018-12-08 DIAGNOSIS — L570 Actinic keratosis: Secondary | ICD-10-CM | POA: Diagnosis not present

## 2018-12-08 DIAGNOSIS — Z7189 Other specified counseling: Secondary | ICD-10-CM | POA: Diagnosis not present

## 2018-12-08 DIAGNOSIS — I10 Essential (primary) hypertension: Secondary | ICD-10-CM | POA: Diagnosis not present

## 2018-12-15 DIAGNOSIS — R109 Unspecified abdominal pain: Secondary | ICD-10-CM | POA: Diagnosis not present

## 2018-12-15 DIAGNOSIS — R350 Frequency of micturition: Secondary | ICD-10-CM | POA: Diagnosis not present

## 2018-12-15 DIAGNOSIS — Z7189 Other specified counseling: Secondary | ICD-10-CM | POA: Diagnosis not present

## 2018-12-23 DIAGNOSIS — N281 Cyst of kidney, acquired: Secondary | ICD-10-CM | POA: Diagnosis not present

## 2018-12-23 DIAGNOSIS — R109 Unspecified abdominal pain: Secondary | ICD-10-CM | POA: Diagnosis not present

## 2018-12-28 DIAGNOSIS — I10 Essential (primary) hypertension: Secondary | ICD-10-CM | POA: Diagnosis not present

## 2018-12-28 DIAGNOSIS — E441 Mild protein-calorie malnutrition: Secondary | ICD-10-CM | POA: Diagnosis not present

## 2018-12-31 DIAGNOSIS — D3611 Benign neoplasm of peripheral nerves and autonomic nervous system of face, head, and neck: Secondary | ICD-10-CM | POA: Diagnosis not present

## 2018-12-31 DIAGNOSIS — D234 Other benign neoplasm of skin of scalp and neck: Secondary | ICD-10-CM | POA: Diagnosis not present

## 2019-01-26 ENCOUNTER — Other Ambulatory Visit: Payer: Self-pay

## 2019-01-26 ENCOUNTER — Encounter: Payer: Self-pay | Admitting: Neurology

## 2019-01-26 ENCOUNTER — Ambulatory Visit (INDEPENDENT_AMBULATORY_CARE_PROVIDER_SITE_OTHER): Payer: Medicare HMO | Admitting: Neurology

## 2019-01-26 ENCOUNTER — Telehealth: Payer: Self-pay | Admitting: Neurology

## 2019-01-26 DIAGNOSIS — D352 Benign neoplasm of pituitary gland: Secondary | ICD-10-CM | POA: Diagnosis not present

## 2019-01-26 HISTORY — DX: Benign neoplasm of pituitary gland: D35.2

## 2019-01-26 NOTE — Telephone Encounter (Signed)
[employer] medicare order sent to GI. They will obtain the auth and reach out to the patient to schedule.  °

## 2019-01-26 NOTE — Progress Notes (Signed)
Reason for visit: Pituitary macroadenoma  Referring physician: [name] is a 68 y.o. 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  The patient has been relatively asymptomatic from this, he has not really been followed for this issue over time.  He has had some issues of the last 6 months or more of some weight loss and fatigue.  He reports no vision changes, no headaches or dizziness, he reports no numbness or weakness of the arms or legs.  He denies any balance changes or difficulty controlling the bowels or the bladder.  He has not had any confusion or memory loss.  He is sent to this office for further evaluation.  Past Medical History:  Diagnosis Date  . Hypertension   . Stroke [facility])     Past Surgical History:  Procedure Laterality Date  . ELBOW SURGERY Right 1970   d/t being hit by shrapnel    History reviewed. No pertinent family history.  Social history:  reports that he has been smoking cigarettes. He has never used smokeless tobacco. He reports previous alcohol use. He reports previous drug use.  Medications:  Prior to Admission medications   Medication Sig Start Date End Date Taking? Authorizing Provider  amLODipine (NORVASC) 5 MG tablet Take 5 mg by mouth daily as needed (high blood pressure).  11/13/17  Yes [provider]  lisinopril-hydrochlorothiazide (ZESTORETIC) 20-12.5 MG tablet Take 1 tablet by mouth daily. 01/14/19  Yes [provider]     No Known Allergies  ROS:  Out of a complete 14 system review of symptoms, the patient complains only of the following symptoms, and all other reviewed systems are negative.  Fatigue Weight loss  Blood pressure (!) 168/77, pulse 71, temperature 98.4 F (36.9 C), height 6\' 4"  (1.93 m), weight 137 lb (62.1 kg).  Physical  Exam  General: The patient is alert and cooperative at the time of the examination.  Eyes: Pupils are equal, round, and reactive to light. Discs are flat bilaterally.  Neck: The neck is supple, no carotid bruits are noted.  Respiratory: The respiratory examination is clear.  Cardiovascular: The cardiovascular examination reveals a regular rate and rhythm, no obvious murmurs or rubs are noted.  Skin: Extremities are without significant edema.  Neurologic Exam  Mental status: The patient is alert and oriented x 3 at the time of the examination. The patient has apparent normal recent and remote memory, with an apparently normal attention span and concentration ability.  Cranial nerves: Facial symmetry is present. There is good sensation of the face to pinprick and soft touch bilaterally. The strength of the facial muscles and the muscles to head turning and shoulder shrug are normal bilaterally. Speech is well enunciated, no aphasia or dysarthria is noted. Extraocular movements are full. Visual fields are full. The tongue is midline, and the patient has symmetric elevation of the soft palate. No obvious hearing deficits are noted.  Motor: The motor testing reveals 5 over 5 strength of all 4 extremities. Good symmetric motor tone is noted throughout.  Sensory: Sensory testing is intact to pinprick, soft touch, vibration sensation, and position sense on all 4 extremities. No evidence of extinction is noted.  Coordination: Cerebellar testing reveals good finger-nose-finger and heel-to-shin bilaterally.  Gait and station: Gait is normal. Tandem gait is normal. Romberg is  negative. No drift is seen.  Reflexes: Deep tendon reflexes are symmetric and normal bilaterally. Toes are downgoing bilaterally.   MRI brain 07/25/14:  IMPRESSION: 1. Two cm Pituitary macro adenoma. Suspect early involvement of the left cavernous sinus, and mass effect on the optic chiasm is stable. 2. No new intracranial  abnormality. Advanced chronic small vessel Disease.  * MRI scan images were reviewed online. I agree with the written report.    Assessment/Plan:  1.  Pituitary macroadenoma  2.  Fatigue, weight loss  The patient will be set up for further blood work today, he will undergo MRI of the brain with and without gadolinium enhancement.  He has had blood work already that included a normal thyroid profile, B12 level, he has had chemistries and CBC done.  Robert Alexanders MD 01/26/2019 11:43 AM  [facility] [address] [place], [place] [address]  Phone [phone] Fax [phone]

## 2019-01-27 LAB — PROLACTIN: Prolactin: 5 ng/mL (ref 4.0–15.2)

## 2019-01-27 LAB — GROWTH HORMONE: Growth Hormone: 0.1 ng/mL (ref 0.0–10.0)

## 2019-01-27 LAB — LUTEINIZING HORMONE: LH: 6.5 m[IU]/mL (ref 1.7–8.6)

## 2019-01-27 LAB — FOLLICLE STIMULATING HORMONE: FSH: 6.3 m[IU]/mL (ref 1.5–12.4)

## 2019-01-27 LAB — ACTH: ACTH: 13.6 pg/mL (ref 7.2–63.3)

## 2019-01-31 ENCOUNTER — Telehealth: Payer: Self-pay

## 2019-01-31 NOTE — Telephone Encounter (Signed)
-----   Message from Kathrynn Ducking, MD sent at 01/27/2019  5:05 PM EDT -----  The blood work results are unremarkable. Please call the patient. ----- Message ----- From: Lavone Neri Lab Results In Sent: 01/27/2019   7:37 AM EDT To: Kathrynn Ducking, MD

## 2019-01-31 NOTE — Telephone Encounter (Signed)
Lvm asking pt to call me back. [facility] # and hours provided.

## 2019-02-01 NOTE — Telephone Encounter (Signed)
Pt returned call, please call back when available.

## 2019-02-01 NOTE — Telephone Encounter (Signed)
I contacted the pt and left a vm with results. PT was advised of [facility] # and hours if he had any questions.

## 2019-02-01 NOTE — Telephone Encounter (Signed)
Left second vm for pt

## 2019-04-06 DIAGNOSIS — E782 Mixed hyperlipidemia: Secondary | ICD-10-CM | POA: Diagnosis not present

## 2019-04-06 DIAGNOSIS — I10 Essential (primary) hypertension: Secondary | ICD-10-CM | POA: Diagnosis not present

## 2019-04-06 DIAGNOSIS — R0989 Other specified symptoms and signs involving the circulatory and respiratory systems: Secondary | ICD-10-CM | POA: Diagnosis not present

## 2019-04-06 DIAGNOSIS — Z7189 Other specified counseling: Secondary | ICD-10-CM | POA: Diagnosis not present

## 2019-04-06 DIAGNOSIS — Z125 Encounter for screening for malignant neoplasm of prostate: Secondary | ICD-10-CM | POA: Diagnosis not present

## 2019-04-06 DIAGNOSIS — I63411 Cerebral infarction due to embolism of right middle cerebral artery: Secondary | ICD-10-CM | POA: Diagnosis not present

## 2019-04-06 DIAGNOSIS — E441 Mild protein-calorie malnutrition: Secondary | ICD-10-CM | POA: Diagnosis not present

## 2019-04-06 DIAGNOSIS — Z0000 Encounter for general adult medical examination without abnormal findings: Secondary | ICD-10-CM | POA: Diagnosis not present

## 2019-04-13 DIAGNOSIS — E782 Mixed hyperlipidemia: Secondary | ICD-10-CM | POA: Diagnosis not present

## 2019-04-13 DIAGNOSIS — D497 Neoplasm of unspecified behavior of endocrine glands and other parts of nervous system: Secondary | ICD-10-CM | POA: Diagnosis not present

## 2019-04-13 DIAGNOSIS — I10 Essential (primary) hypertension: Secondary | ICD-10-CM | POA: Diagnosis not present

## 2019-04-13 DIAGNOSIS — I63411 Cerebral infarction due to embolism of right middle cerebral artery: Secondary | ICD-10-CM | POA: Diagnosis not present

## 2019-04-13 DIAGNOSIS — E789 Disorder of lipoprotein metabolism, unspecified: Secondary | ICD-10-CM | POA: Diagnosis not present

## 2019-04-13 DIAGNOSIS — Z0000 Encounter for general adult medical examination without abnormal findings: Secondary | ICD-10-CM | POA: Diagnosis not present

## 2019-04-13 DIAGNOSIS — G8194 Hemiplegia, unspecified affecting left nondominant side: Secondary | ICD-10-CM | POA: Diagnosis not present

## 2019-04-13 DIAGNOSIS — R69 Illness, unspecified: Secondary | ICD-10-CM | POA: Diagnosis not present

## 2019-04-13 DIAGNOSIS — E441 Mild protein-calorie malnutrition: Secondary | ICD-10-CM | POA: Diagnosis not present

## 2019-04-20 DIAGNOSIS — I743 Embolism and thrombosis of arteries of the lower extremities: Secondary | ICD-10-CM | POA: Diagnosis not present

## 2019-04-20 DIAGNOSIS — R0989 Other specified symptoms and signs involving the circulatory and respiratory systems: Secondary | ICD-10-CM | POA: Diagnosis not present

## 2019-04-20 DIAGNOSIS — I70203 Unspecified atherosclerosis of native arteries of extremities, bilateral legs: Secondary | ICD-10-CM | POA: Diagnosis not present

## 2019-04-25 DIAGNOSIS — I10 Essential (primary) hypertension: Secondary | ICD-10-CM | POA: Diagnosis not present

## 2019-04-25 DIAGNOSIS — R6889 Other general symptoms and signs: Secondary | ICD-10-CM | POA: Diagnosis not present

## 2019-04-25 DIAGNOSIS — I739 Peripheral vascular disease, unspecified: Secondary | ICD-10-CM | POA: Diagnosis not present

## 2019-05-10 ENCOUNTER — Encounter: Payer: Self-pay | Admitting: Gastroenterology

## 2019-05-26 ENCOUNTER — Encounter: Payer: Self-pay | Admitting: Gastroenterology

## 2019-05-26 ENCOUNTER — Ambulatory Visit (AMBULATORY_SURGERY_CENTER): Payer: Medicare HMO

## 2019-05-26 ENCOUNTER — Other Ambulatory Visit: Payer: Self-pay

## 2019-05-26 VITALS — Temp 97.6°F | Ht 76.0 in | Wt 136.0 lb

## 2019-05-26 DIAGNOSIS — Z1211 Encounter for screening for malignant neoplasm of colon: Secondary | ICD-10-CM

## 2019-05-26 DIAGNOSIS — Z1159 Encounter for screening for other viral diseases: Secondary | ICD-10-CM

## 2019-05-26 MED ORDER — NA SULFATE-K SULFATE-MG SULF 17.5-3.13-1.6 GM/177ML PO SOLN: 1.0000 | Freq: Once | ORAL | 0 refills | Status: AC

## 2019-05-26 NOTE — Progress Notes (Signed)

## 2019-05-27 ENCOUNTER — Telehealth: Payer: Self-pay | Admitting: Gastroenterology

## 2019-05-27 NOTE — Telephone Encounter (Signed)
Patient's wife states the prep is $103. Suprep sample (lot [id number]. 9/22) left at the 3rd floor for the patient-pt's wife aware.

## 2019-06-07 ENCOUNTER — Encounter: Payer: Medicare HMO | Admitting: Vascular Surgery

## 2019-06-08 ENCOUNTER — Encounter: Payer: Self-pay | Admitting: Family

## 2019-06-09 ENCOUNTER — Encounter: Payer: Medicare HMO | Admitting: Gastroenterology

## 2019-06-22 ENCOUNTER — Other Ambulatory Visit: Payer: Self-pay

## 2019-06-22 ENCOUNTER — Ambulatory Visit (INDEPENDENT_AMBULATORY_CARE_PROVIDER_SITE_OTHER): Payer: Medicare HMO | Admitting: Vascular Surgery

## 2019-06-22 ENCOUNTER — Encounter: Payer: Self-pay | Admitting: Vascular Surgery

## 2019-06-22 VITALS — BP 137/70 | HR 82 | Temp 98.5°F | Resp 16 | Ht 76.0 in | Wt 133.0 lb

## 2019-06-22 DIAGNOSIS — I739 Peripheral vascular disease, unspecified: Secondary | ICD-10-CM

## 2019-06-22 MED ORDER — ROSUVASTATIN CALCIUM 10 MG PO TABS: 10.0000 mg | ORAL_TABLET | Freq: Every day | ORAL | 11 refills | Status: DC

## 2019-06-22 NOTE — H&P (View-Only) (Signed)
Referring Physician: Dr. Ashby Dawes  Patient name: Robert Mckay MRN: [id number] DOB: 03/17/1951 Sex: male  REASON FOR CONSULT: Bilateral peripheral arterial disease  HPI: Robert Mckay is a 68 y.o. male, with a several month history of worsening pain in his right leg.  He is also noticed that the muscles in his right leg have become smaller than the left.  When he walks about a block he develops pain in his right calf that takes several minutes to go away.  He does not have any pain in his foot at nighttime when sleeping.  He does not have any nonhealing wounds.  He states he really has no problems with the left leg.  He did have a stroke many years ago which affected his left side but recovered from this.  His stroke was in 2015.  He had no significant carotid disease that time.  He does currently smoke.  He was counseled against this today.  He is currently on aspirin.  He is not on a statin.  Other medical problems include hypertension which has been stable.  Past Medical History:  Diagnosis Date  . Hypertension   . Pituitary macroadenoma ([place]) 01/26/2019  . Stroke [facility])    pt states 2015   Past Surgical History:  Procedure Laterality Date  . ELBOW SURGERY Right 1970   d/t being hit by shrapnel    Family History  Problem Relation Age of Onset  . Colon cancer Neg Hx   . Colon polyps Neg Hx   . Esophageal cancer Neg Hx   . Rectal cancer Neg Hx   . Stomach cancer Neg Hx     SOCIAL HISTORY: Social History   Socioeconomic History  . Marital status: Single    Spouse name: Not on file  . Number of children: 6  . Years of education: Not on file  . Highest education level: High school graduate  Occupational History  . Not on file  Tobacco Use  . Smoking status: Current Some Day Smoker    Packs/day: 0.25    Types: Cigarettes  . Smokeless tobacco: Never Used  Substance and Sexual Activity  . Alcohol use: Not Currently  . Drug use: Not Currently    Comment: in the  [place], but none in years  . Sexual activity: Not on file  Other Topics Concern  . Not on file  Social History Narrative   Lives at home with his mother   Right handed   Caffeine: 1 cup every now and then   Social Determinants of Health   Financial Resource Strain:   . Difficulty of Paying Living Expenses: Not on file  Food Insecurity:   . Worried About Charity fundraiser in the Last Year: Not on file  . Ran Out of Food in the Last Year: Not on file  Transportation Needs:   . Lack of Transportation (Medical): Not on file  . Lack of Transportation (Non-Medical): Not on file  Physical Activity:   . Days of Exercise per Week: Not on file  . Minutes of Exercise per Session: Not on file  Stress:   . Feeling of Stress : Not on file  Social Connections:   . Frequency of Communication with Friends and Family: Not on file  . Frequency of Social Gatherings with Friends and Family: Not on file  . Attends Religious Services: Not on file  . Active Member of Clubs or Organizations: Not on file  . Attends Club or  Organization Meetings: Not on file  . Marital Status: Not on file  Intimate Partner Violence:   . Fear of Current or Ex-Partner: Not on file  . Emotionally Abused: Not on file  . Physically Abused: Not on file  . 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  He was in [place] from 1966-68.  No Known Allergies  Current Outpatient Medications  Medication Sig Dispense Refill  . amLODipine (NORVASC) 5 MG tablet Take 5 mg by mouth daily as needed (high blood pressure).   3  . lisinopril-hydrochlorothiazide (ZESTORETIC) 20-12.5 MG tablet Take 1 tablet by mouth daily.     No current facility-administered medications for this visit.    ROS:   General:  No weight loss, Fever, chills  HEENT: No recent headaches, no nasal bleeding, no visual changes, no sore throat  Neurologic: No dizziness, blackouts, seizures. No recent symptoms of stroke or  mini- stroke. No recent episodes of slurred speech, or temporary blindness.  Cardiac: No recent episodes of chest pain/pressure, no shortness of breath at rest.  No shortness of breath with exertion.  Denies history of atrial fibrillation or irregular heartbeat  Vascular: No history of rest pain in feet.  + history of claudication.  No history of non-healing ulcer, No history of DVT   Pulmonary: No home oxygen, no productive cough, no hemoptysis,  No asthma or wheezing  Musculoskeletal:  [ ]  Arthritis, [ ]  Low back pain,  [ ]  Joint pain  Hematologic:No history of hypercoagulable state.  No history of easy bleeding.  No history of anemia  Gastrointestinal: No hematochezia or melena,  No gastroesophageal reflux, no trouble swallowing  Urinary: [ ]  chronic Kidney disease, [ ]  on HD - [ ]  MWF or [ ]  TTHS, [ ]  Burning with urination, [ ]  Frequent urination, [ ]  Difficulty urinating;   Skin: No rashes  Psychological: No history of anxiety,  No history of depression   Physical Examination  Vitals:   06/22/19 0947  BP: 137/70  Pulse: 82  Resp: 16  Temp: 98.5 F (36.9 C)  TempSrc: Temporal  SpO2: 100%  Weight: 133 lb (60.3 kg)  Height: 6\' 4"  (1.93 m)    Body mass index is 16.19 kg/m.  General:  Alert and oriented, no acute distress HEENT: Normal Neck: No JVD Pulmonary: Clear to auscultation bilaterally Cardiac: Regular Rate and Rhythm  Abdomen: Soft, non-tender, non-distended, no mass Skin: No rash Extremity Pulses:  2+ radial, brachial, absent right 2+ left femoral, absent popliteal dorsalis pedis, posterior tibial pulses bilaterally Musculoskeletal: No deformity or edema, atrophy of right calf muscles approximately 10 to 15% smaller than left  Neurologic: Upper and lower extremity motor 5/5 and symmetric  DATA:  Patient had bilateral ABIs performed October 2020 through [facility].  I reviewed those results today.  This showed bilateral ABIs of 0.4.   There was a suggestion of inflow disease on the left side.  There were bilateral superficial femoral artery occlusions.  ASSESSMENT: Bilateral peripheral arterial disease right leg symptomatic currently.  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  He feels very limited in his walking distance in the right leg.   PLAN: Start Crestor 10 mg once daily.  He was given a prescription for 30 tablets with 11 refills.  Try to quit smoking.  Patient is scheduled for aortogram lower  extremity runoff possible intervention on July 01, 2019.  Risk benefits possible complications and procedure details including not limited to bleeding infection vessel injury contrast reaction were discussed with patient today.  He understands and agrees to proceed.   Ruta Hinds, MD [facility] Office: [phone]: [phone]

## 2019-06-22 NOTE — Progress Notes (Signed)
Referring Physician: Dr. Ashby Dawes  Patient name: Robert Mckay MRN: [id number] DOB: 09-13-1950 Sex: male  REASON FOR CONSULT: Bilateral peripheral arterial disease  HPI: Robert Mckay is a 68 y.o. male, with a several month history of worsening pain in his right leg.  He is also noticed that the muscles in his right leg have become smaller than the left.  When he walks about a block he develops pain in his right calf that takes several minutes to go away.  He does not have any pain in his foot at nighttime when sleeping.  He does not have any nonhealing wounds.  He states he really has no problems with the left leg.  He did have a stroke many years ago which affected his left side but recovered from this.  His stroke was in 2015.  He had no significant carotid disease that time.  He does currently smoke.  He was counseled against this today.  He is currently on aspirin.  He is not on a statin.  Other medical problems include hypertension which has been stable.  Past Medical History:  Diagnosis Date  . Hypertension   . 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 Stroke [facility])    pt states 2015   Past Surgical History:  Procedure Laterality Date  . ELBOW SURGERY Right 1970   d/t being hit by shrapnel    Family History  Problem Relation Age of Onset  . Colon cancer Neg Hx   . Colon polyps Neg Hx   . Esophageal cancer Neg Hx   . Rectal cancer Neg Hx   . Stomach cancer Neg Hx     SOCIAL HISTORY: Social History   Socioeconomic History  . Marital status: Single    Spouse name: Not on file  . Number of children: 6  . Years of education: Not on file  . Highest education level: High school graduate  Occupational History  . Not on file  Tobacco Use  . Smoking status: Current Some Day Smoker    Packs/day: 0.25    Types: Cigarettes  . Smokeless tobacco: Never Used  Substance and Sexual Activity  . Alcohol use: Not Currently  . Drug use: Not Currently    Comment: in the  [place], but none in years  . Sexual activity: Not on file  Other Topics Concern  . Not on file  Social History Narrative   Lives at home with his mother   Right handed   Caffeine: 1 cup every now and then   Social Determinants of Health   Financial Resource Strain:   . Difficulty of Paying Living Expenses: Not on file  Food Insecurity:   . Worried About Charity fundraiser in the Last Year: Not on file  . Ran Out of Food in the Last Year: Not on file  Transportation Needs:   . Lack of Transportation (Medical): Not on file  . Lack of Transportation (Non-Medical): Not on file  Physical Activity:   . Days of Exercise per Week: Not on file  . Minutes of Exercise per Session: Not on file  Stress:   . Feeling of Stress : Not on file  Social Connections:   . Frequency of Communication with Friends and Family: Not on file  . Frequency of Social Gatherings with Friends and Family: Not on file  . Attends Religious Services: Not on file  . Active Member of Clubs or Organizations: Not on file  . Attends Club or  Organization Meetings: Not on file  . Marital Status: Not on file  Intimate Partner Violence:   . Fear of Current or Ex-Partner: Not on file  . Emotionally Abused: Not on file  . Physically Abused: Not on file  . 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  He was in [place] from 1966-68.  No Known Allergies  Current Outpatient Medications  Medication Sig Dispense Refill  . amLODipine (NORVASC) 5 MG tablet Take 5 mg by mouth daily as needed (high blood pressure).   3  . lisinopril-hydrochlorothiazide (ZESTORETIC) 20-12.5 MG tablet Take 1 tablet by mouth daily.     No current facility-administered medications for this visit.    ROS:   General:  No weight loss, Fever, chills  HEENT: No recent headaches, no nasal bleeding, no visual changes, no sore throat  Neurologic: No dizziness, blackouts, seizures. No recent symptoms of stroke or  mini- stroke. No recent episodes of slurred speech, or temporary blindness.  Cardiac: No recent episodes of chest pain/pressure, no shortness of breath at rest.  No shortness of breath with exertion.  Denies history of atrial fibrillation or irregular heartbeat  Vascular: No history of rest pain in feet.  + history of claudication.  No history of non-healing ulcer, No history of DVT   Pulmonary: No home oxygen, no productive cough, no hemoptysis,  No asthma or wheezing  Musculoskeletal:  [ ]  Arthritis, [ ]  Low back pain,  [ ]  Joint pain  Hematologic:No history of hypercoagulable state.  No history of easy bleeding.  No history of anemia  Gastrointestinal: No hematochezia or melena,  No gastroesophageal reflux, no trouble swallowing  Urinary: [ ]  chronic Kidney disease, [ ]  on HD - [ ]  MWF or [ ]  TTHS, [ ]  Burning with urination, [ ]  Frequent urination, [ ]  Difficulty urinating;   Skin: No rashes  Psychological: No history of anxiety,  No history of depression   Physical Examination  Vitals:   06/22/19 0947  BP: 137/70  Pulse: 82  Resp: 16  Temp: 98.5 F (36.9 C)  TempSrc: Temporal  SpO2: 100%  Weight: 133 lb (60.3 kg)  Height: 6\' 4"  (1.93 m)    Body mass index is 16.19 kg/m.  General:  Alert and oriented, no acute distress HEENT: Normal Neck: No JVD Pulmonary: Clear to auscultation bilaterally Cardiac: Regular Rate and Rhythm  Abdomen: Soft, non-tender, non-distended, no mass Skin: No rash Extremity Pulses:  2+ radial, brachial, absent right 2+ left femoral, absent popliteal dorsalis pedis, posterior tibial pulses bilaterally Musculoskeletal: No deformity or edema, atrophy of right calf muscles approximately 10 to 15% smaller than left  Neurologic: Upper and lower extremity motor 5/5 and symmetric  DATA:  Patient had bilateral ABIs performed October 2020 through [facility].  I reviewed those results today.  This showed bilateral ABIs of 0.4.   There was a suggestion of inflow disease on the left side.  There were bilateral superficial femoral artery occlusions.  ASSESSMENT: Bilateral peripheral arterial disease right leg symptomatic currently.  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  He feels very limited in his walking distance in the right leg.   PLAN: Start Crestor 10 mg once daily.  He was given a prescription for 30 tablets with 11 refills.  Try to quit smoking.  Patient is scheduled for aortogram lower  extremity runoff possible intervention on July 01, 2019.  Risk benefits possible complications and procedure details including not limited to bleeding infection vessel injury contrast reaction were discussed with patient today.  He understands and agrees to proceed.   Ruta Hinds, MD [facility] [place] Office: [phone] Pager: [phone]

## 2019-06-28 ENCOUNTER — Other Ambulatory Visit (HOSPITAL_COMMUNITY)
Admission: RE | Admit: 2019-06-28 | Discharge: 2019-06-28 | Disposition: A | Discharge status: Home / Self Care | Payer: Medicare Other | Source: Ambulatory Visit | Attending: Vascular Surgery | Admitting: Vascular Surgery

## 2019-06-28 DIAGNOSIS — Z20822 Contact with and (suspected) exposure to covid-19: Secondary | ICD-10-CM | POA: Diagnosis not present

## 2019-06-28 DIAGNOSIS — Z01812 Encounter for preprocedural laboratory examination: Secondary | ICD-10-CM | POA: Insufficient documentation

## 2019-06-30 LAB — NOVEL CORONAVIRUS, NAA (HOSP ORDER, SEND-OUT TO REF LAB; TAT 18-24 HRS): SARS-CoV-2, NAA: NOT DETECTED

## 2019-07-01 ENCOUNTER — Encounter (HOSPITAL_COMMUNITY)
Admission: RE | Disposition: A | Discharge status: Home / Self Care | Payer: Self-pay | Source: Ambulatory Visit | Attending: Vascular Surgery

## 2019-07-01 ENCOUNTER — Ambulatory Visit (HOSPITAL_COMMUNITY)
Admission: RE | Admit: 2019-07-01 | Discharge: 2019-07-01 | Disposition: A | Discharge status: Home / Self Care | Payer: Medicare Other | Source: Ambulatory Visit | Attending: Vascular Surgery | Admitting: Vascular Surgery

## 2019-07-01 ENCOUNTER — Other Ambulatory Visit: Payer: Self-pay

## 2019-07-01 DIAGNOSIS — I10 Essential (primary) hypertension: Secondary | ICD-10-CM | POA: Diagnosis not present

## 2019-07-01 DIAGNOSIS — Z79899 Other long term (current) drug therapy: Secondary | ICD-10-CM | POA: Diagnosis not present

## 2019-07-01 DIAGNOSIS — Z8673 Personal history of transient ischemic attack (TIA), and cerebral infarction without residual deficits: Secondary | ICD-10-CM | POA: Diagnosis not present

## 2019-07-01 DIAGNOSIS — Z7982 Long term (current) use of aspirin: Secondary | ICD-10-CM | POA: Insufficient documentation

## 2019-07-01 DIAGNOSIS — I70213 Atherosclerosis of native arteries of extremities with intermittent claudication, bilateral legs: Secondary | ICD-10-CM | POA: Diagnosis not present

## 2019-07-01 DIAGNOSIS — F17210 Nicotine dependence, cigarettes, uncomplicated: Secondary | ICD-10-CM | POA: Insufficient documentation

## 2019-07-01 HISTORY — PX: PERIPHERAL VASCULAR INTERVENTION: CATH118257

## 2019-07-01 HISTORY — PX: ABDOMINAL AORTOGRAM W/LOWER EXTREMITY: CATH118223

## 2019-07-01 LAB — POCT I-STAT, CHEM 8
BUN: 18 mg/dL (ref 8–23)
Calcium, Ion: 1.2 mmol/L (ref 1.15–1.40)
Chloride: 102 mmol/L (ref 98–111)
Creatinine, Ser: 1.2 mg/dL (ref 0.61–1.24)
Glucose, Bld: 107 mg/dL — ABNORMAL HIGH (ref 70–99)
HCT: 39 % (ref 39.0–52.0)
Hemoglobin: 13.3 g/dL (ref 13.0–17.0)
Potassium: 3.5 mmol/L (ref 3.5–5.1)
Sodium: 142 mmol/L (ref 135–145)
TCO2: 30 mmol/L (ref 22–32)

## 2019-07-01 LAB — POCT ACTIVATED CLOTTING TIME
Activated Clotting Time: 230 seconds
Activated Clotting Time: 197 seconds
Activated Clotting Time: 175 seconds

## 2019-07-01 SURGERY — ABDOMINAL AORTOGRAM W/LOWER EXTREMITY
Anesthesia: LOCAL

## 2019-07-01 MED ORDER — SODIUM CHLORIDE 0.9% FLUSH: 3.0000 mL | Freq: Two times a day (BID) | INTRAVENOUS | Status: DC

## 2019-07-01 MED ORDER — OXYCODONE HCL 5 MG PO TABS: 5.0000 mg | ORAL_TABLET | ORAL | Status: DC | PRN

## 2019-07-01 MED ORDER — HYDRALAZINE HCL 20 MG/ML IJ SOLN: 5.0000 mg | INTRAMUSCULAR | Status: DC | PRN

## 2019-07-01 MED ORDER — HYDRALAZINE HCL 20 MG/ML IJ SOLN
INTRAMUSCULAR | Status: AC
  Filled 2019-07-01: qty 1

## 2019-07-01 MED ORDER — LABETALOL HCL 5 MG/ML IV SOLN
10.0000 mg | INTRAVENOUS | Status: DC | PRN
  Administered 2019-07-01: 10 mg via INTRAVENOUS

## 2019-07-01 MED ORDER — SODIUM CHLORIDE 0.9 % IV SOLN: INTRAVENOUS | Status: DC

## 2019-07-01 MED ORDER — HEPARIN (PORCINE) IN NACL 1000-0.9 UT/500ML-% IV SOLN
INTRAVENOUS | Status: DC | PRN
  Administered 2019-07-01: 500 mL

## 2019-07-01 MED ORDER — HYDRALAZINE HCL 20 MG/ML IJ SOLN
INTRAMUSCULAR | Status: DC | PRN
  Administered 2019-07-01: 10 mg via INTRAVENOUS

## 2019-07-01 MED ORDER — LIDOCAINE HCL (PF) 1 % IJ SOLN
INTRAMUSCULAR | Status: AC
  Filled 2019-07-01: qty 30

## 2019-07-01 MED ORDER — LABETALOL HCL 5 MG/ML IV SOLN
INTRAVENOUS | Status: AC
  Filled 2019-07-01: qty 4

## 2019-07-01 MED ORDER — ACETAMINOPHEN 325 MG PO TABS: 650.0000 mg | ORAL_TABLET | ORAL | Status: DC | PRN

## 2019-07-01 MED ORDER — SODIUM CHLORIDE 0.9% FLUSH: 3.0000 mL | INTRAVENOUS | Status: DC | PRN

## 2019-07-01 MED ORDER — IODIXANOL 320 MG/ML IV SOLN
INTRAVENOUS | Status: DC | PRN
  Administered 2019-07-01: 170 mL

## 2019-07-01 MED ORDER — HEPARIN (PORCINE) IN NACL 1000-0.9 UT/500ML-% IV SOLN
INTRAVENOUS | Status: AC
  Filled 2019-07-01: qty 1000

## 2019-07-01 MED ORDER — ASPIRIN EC 81 MG PO TBEC
81.0000 mg | DELAYED_RELEASE_TABLET | Freq: Every day | ORAL | Status: DC
  Administered 2019-07-01: 81 mg via ORAL
  Filled 2019-07-01: qty 1

## 2019-07-01 MED ORDER — HEPARIN SODIUM (PORCINE) 1000 UNIT/ML IJ SOLN
INTRAMUSCULAR | Status: DC | PRN
  Administered 2019-07-01: 6000 [IU] via INTRAVENOUS

## 2019-07-01 MED ORDER — MORPHINE SULFATE (PF) 2 MG/ML IV SOLN: 2.0000 mg | INTRAVENOUS | Status: DC | PRN

## 2019-07-01 MED ORDER — HEPARIN SODIUM (PORCINE) 1000 UNIT/ML IJ SOLN
INTRAMUSCULAR | Status: AC
  Filled 2019-07-01: qty 1

## 2019-07-01 MED ORDER — ASPIRIN EC 81 MG PO TBEC: 81.0000 mg | DELAYED_RELEASE_TABLET | Freq: Every day | ORAL | 2 refills | Status: AC

## 2019-07-01 MED ORDER — ONDANSETRON HCL 4 MG/2ML IJ SOLN: 4.0000 mg | Freq: Four times a day (QID) | INTRAMUSCULAR | Status: DC | PRN

## 2019-07-01 MED ORDER — SODIUM CHLORIDE 0.9 % IV SOLN: 250.0000 mL | INTRAVENOUS | Status: DC | PRN

## 2019-07-01 MED ORDER — LIDOCAINE HCL (PF) 1 % IJ SOLN
INTRAMUSCULAR | Status: DC | PRN
  Administered 2019-07-01: 15 mL

## 2019-07-01 MED ORDER — SODIUM CHLORIDE 0.9 % IV SOLN: INTRAVENOUS | Status: AC

## 2019-07-01 SURGICAL SUPPLY — 17 items
CATH ANGIO 5F PIGTAIL 65CM (CATHETERS) ×1 IMPLANT
CATH CROSS OVER TEMPO 5F (CATHETERS) ×1 IMPLANT
CATH SOFT-VU ST 4F 90CM (CATHETERS) ×1 IMPLANT
DEVICE TORQUE .025-.038 (MISCELLANEOUS) ×1 IMPLANT
GUIDEWIRE ANGLED .035X150CM (WIRE) ×1 IMPLANT
KIT ENCORE 26 ADVANTAGE (KITS) ×1 IMPLANT
KIT PV (KITS) ×3 IMPLANT
SHEATH PINNACLE 5F 10CM (SHEATH) ×1 IMPLANT
SHEATH PINNACLE MP 7F 45CM (SHEATH) ×1 IMPLANT
SHEATH PROBE COVER 6X72 (BAG) ×1 IMPLANT
STENT VIABAHN VBX 6X39X135 (Permanent Stent) ×1 IMPLANT
SYR MEDRAD MARK V 150ML (SYRINGE) ×1 IMPLANT
TRANSDUCER W/STOPCOCK (MISCELLANEOUS) ×3 IMPLANT
TRAY PV CATH (CUSTOM PROCEDURE TRAY) ×3 IMPLANT
WIRE HI TORQ VERSACORE 300 (WIRE) ×1 IMPLANT
WIRE HITORQ VERSACORE ST 145CM (WIRE) ×1 IMPLANT
WIRE ROSEN-J .035X180CM (WIRE) ×1 IMPLANT

## 2019-07-01 NOTE — Interval H&P Note (Signed)
History and Physical Interval Note:  07/01/2019 1:18 PM  Robert Mckay  has presented today for surgery, with the diagnosis of bilateral artery disease.  The various methods of treatment have been discussed with the patient and family. After consideration of risks, benefits and other options for treatment, the patient has consented to  Procedure(s): ABDOMINAL AORTOGRAM W/LOWER EXTREMITY (N/A) as a surgical intervention.  The patient's history has been reviewed, patient examined, no change in status, stable for surgery.  I have reviewed the patient's chart and labs.  Questions were answered to the patient's satisfaction.     Ruta Hinds

## 2019-07-01 NOTE — Discharge Instructions (Signed)

## 2019-07-01 NOTE — Progress Notes (Signed)
Asked Dr. Oneida Alar about if patient needed any Plavix or other antiplatelet. MD said no, and that he had taken aspirin earlier today.

## 2019-07-01 NOTE — Interval H&P Note (Signed)
History and Physical Interval Note:  07/01/2019 9:52 AM  Robert Mckay  has presented today for surgery, with the diagnosis of bilateral artery disease.  The various methods of treatment have been discussed with the patient and family. After consideration of risks, benefits and other options for treatment, the patient has consented to  Procedure(s): ABDOMINAL AORTOGRAM W/LOWER EXTREMITY (N/A) as a surgical intervention.  The patient's history has been reviewed, patient examined, no change in status, stable for surgery.  I have reviewed the patient's chart and labs.  Questions were answered to the patient's satisfaction.     Ruta Hinds

## 2019-07-01 NOTE — Progress Notes (Signed)
Dr Trula Slade notified of need for medication reconciliation

## 2019-07-01 NOTE — Op Note (Signed)
Procedure: Abdominal aortogram with bilateral lower extremity runoff, right common iliac stent (6 x 60 VBX)  Preoperative diagnosis: Claudication  Postoperative diagnosis: Same  Anesthesia: Local  Operative findings: 1.  80% right common iliac stenosis stented to 0% residual stenosis  2.  Bilateral SFA occlusions  3.  Bilateral severe tibial artery occlusive disease with remnant posterior tibial artery to ankle bilaterally starting from just above the ankle  Operative details: After obtaining form consent, the patient was taken the [facility] lab.  The patient was placed supine position angio table.  Both groins were prepped and draped in usual sterile fashion.  Local anesthesia was infiltrated of the left common femoral artery.  Introducer needle was used to cannulate the left common femoral artery and an 035 versa core is placed up in the abdominal aorta fluoroscopic guidance.  Next a 6 5 French sheath placed over the guidewire and left common femoral artery.  5 French pigtail catheter was advanced over the guidewire in the abdominal aorta abdominal aortogram was obtained in AP projection.  Shows left and right renal arteries are patent.  Infrarenal abdominal aorta is patent.  Left and right common iliac arteries are patent.  The internal iliac arteries are occluded bilaterally.  The external iliac arteries are patent but there is an 80% narrowing of the right common iliac artery at its mid to distal section.  This point the pigtail catheter was pulled down and bilateral oblique views of the pelvis were performed to confirm the above findings.  At this point bilateral lower extremity runoff views were obtained through the pigtail catheter.  In the left lower extremity, the left common femoral profunda femoris is patent.  The left superficial femoral artery occludes at its origin.  The above-knee popliteal artery reconstitutes via profunda collaterals.  The entire popliteal artery is patent the anterior  tibial artery is occluded the peroneal artery is occluded the posterior tibial artery is occluded but does reconstitute via collaterals at the level of the ankle.  In the right lower extremity, there are similar findings to the left.  At this point we decided to intervene on the right common iliac artery stenosis.  A 5 French crossover catheter was used to selectively catheterize the right common iliac artery origin on 035 angled Glidewire advanced across the lesion.  Patient was given 6000 units of intravenous heparin.  ACT was greater than 200.  A 4 French straight catheter was placed over the Glidewire to swapped this out for an [employer] wire.  A 7 French destination sheath was then advanced up and over the aortic bifurcation to predilate the lesion.  The sheath was pulled back slightly to create a roadmap for placement of a stent.  A 6 x 60 VBX stent was selected and advanced and centered on the lesion and deployed to nominal pressure for 1 minute.  Completion angiogram showed 0% residual stenosis.  Guidewire was pulled back sheath was pulled back and left in the left common femoral artery pulled the holding area when ACT is less than 175.  Patient tired procedure well and there were no complications.  Patient was taken the holding area in stable condition.  Operative management: Patient now has good inflow to both legs.  If he still has persistent claudication symptoms neck step would be a right femoral to above-knee popliteal bypass on the right side.  Patient does have severe tibial disease and it is imperative that he quit smoking otherwise he will be at high risk  of limb loss long-term.  All these findings were discussed with the patient and his wife as well.  We will schedule patient a follow-up appointment with me in 1 month with repeat ABIs and then at that point consider whether or not he needs a right femoral above-knee popliteal bypass.  Ruta Hinds, MD [facility] [place] Office: [phone]

## 2019-07-01 NOTE — Progress Notes (Signed)
Site area- left  Site Prior to Removal- 0   Pressure Applied For-  20 MInutes   Bedrest Beginning at - 1430   Manual- Yes   Patient Status During Pull- Stable    Post Pull Groin Site- 1   Post Pull Instructions Given- Yes   Post Pull Pulses Present- No    Dressing Applied- Tegaderm and Gauze Dressing    Comments:  no pulse prior

## 2019-07-01 NOTE — Progress Notes (Signed)
Up and walked and tolerated well; left groin stable, no bleeding or hematoma 

## 2019-07-05 LAB — POCT I-STAT, CHEM 8
BUN: 25 mg/dL — ABNORMAL HIGH (ref 8–23)
Calcium, Ion: 1.01 mmol/L — ABNORMAL LOW (ref 1.15–1.40)
Chloride: 106 mmol/L (ref 98–111)
Creatinine, Ser: 1.2 mg/dL (ref 0.61–1.24)
Glucose, Bld: 109 mg/dL — ABNORMAL HIGH (ref 70–99)
HCT: 41 % (ref 39.0–52.0)
Hemoglobin: 13.9 g/dL (ref 13.0–17.0)
Potassium: 8.4 mmol/L (ref 3.5–5.1)
Sodium: 136 mmol/L (ref 135–145)
TCO2: 30 mmol/L (ref 22–32)

## 2019-08-11 ENCOUNTER — Encounter: Payer: Medicare Other | Admitting: Vascular Surgery

## 2019-08-11 ENCOUNTER — Ambulatory Visit (HOSPITAL_COMMUNITY): Payer: Medicare Other

## 2019-08-15 ENCOUNTER — Other Ambulatory Visit: Payer: Self-pay

## 2019-08-15 ENCOUNTER — Ambulatory Visit (HOSPITAL_COMMUNITY)
Admission: RE | Admit: 2019-08-15 | Discharge: 2019-08-15 | Disposition: A | Discharge status: Home / Self Care | Payer: Medicare Other | Source: Ambulatory Visit | Attending: Vascular Surgery | Admitting: Vascular Surgery

## 2019-08-15 DIAGNOSIS — I739 Peripheral vascular disease, unspecified: Secondary | ICD-10-CM | POA: Insufficient documentation

## 2019-08-16 ENCOUNTER — Encounter (HOSPITAL_COMMUNITY): Payer: Medicare Other

## 2019-08-17 ENCOUNTER — Other Ambulatory Visit: Payer: Self-pay

## 2019-08-17 ENCOUNTER — Ambulatory Visit (INDEPENDENT_AMBULATORY_CARE_PROVIDER_SITE_OTHER): Payer: Medicare Other | Admitting: Vascular Surgery

## 2019-08-17 ENCOUNTER — Encounter: Payer: Self-pay | Admitting: Vascular Surgery

## 2019-08-17 VITALS — BP 169/85 | HR 75 | Temp 97.9°F | Resp 18 | Ht 76.0 in | Wt 137.4 lb

## 2019-08-17 DIAGNOSIS — I739 Peripheral vascular disease, unspecified: Secondary | ICD-10-CM | POA: Diagnosis not present

## 2019-08-17 NOTE — Progress Notes (Signed)
Patient is a 69 year old male status post right common iliac stent 07/01/2019.  This was done for claudication symptoms in the right leg.  The claudication symptoms in his right leg have now completely resolved.  At his initial presentation he did not have any rest pain or nonhealing wounds.  His left leg was asymptomatic.  He is on aspirin and a statin.  He has no shortness of breath.  He has no chest pain.  Current Outpatient Medications on File Prior to Visit  Medication Sig Dispense Refill  . amLODipine (NORVASC) 5 MG tablet Take 5 mg by mouth daily.   3  . aspirin EC 81 MG tablet Take 1 tablet (81 mg total) by mouth daily. 150 tablet 2  . lisinopril-hydrochlorothiazide (ZESTORETIC) 20-12.5 MG tablet Take 1 tablet by mouth daily.    . Multiple Vitamin (MULTIVITAMIN WITH MINERALS) TABS tablet Take 1 tablet by mouth daily. One A Day Men's Multivitamin    . rosuvastatin (CRESTOR) 10 MG tablet Take 1 tablet (10 mg total) by mouth daily. 30 tablet 11   No current facility-administered medications on file prior to visit.   Physical exam:  Vitals:   08/17/19 1433  BP: (!) 169/85  Pulse: 75  Resp: 18  Temp: 97.9 F (36.6 C)  TempSrc: Temporal  SpO2: 100%  Weight: 137 lb 6.4 oz (62.3 kg)  Height: 6\' 4"  (1.93 m)    Extremities: 2+ femoral pulses bilaterally absent popliteal and pedal pulses  Skin: No open ulcer or wound  Data: ABIs performed on 08/15/2019 showed an ABI on the right of 0.4 left 0.49.  This is compared to ABIs that were performed in October 2020 at [facility].  ABIs were also 0.4 at that time.  Assessment: Bilateral peripheral arterial disease.  Although the patient has bilateral SFA occlusions and tibial disease.  He is currently asymptomatic.  He states the claudication symptoms in his right leg resolved with iliac stenting.  Plan: The patient will again try to quit smoking.  He will follow-up with [place] in 6 months time with repeat ABIs.  If he develops  recurrent symptoms of claudication rest pain or nonhealing wounds we would consider an additional intervention.  He will continue his aspirin and statin.  Ruta Hinds, MD [facility] [place] Office: [phone]

## 2019-08-19 ENCOUNTER — Other Ambulatory Visit: Payer: Self-pay | Admitting: *Deleted

## 2019-08-19 DIAGNOSIS — I739 Peripheral vascular disease, unspecified: Secondary | ICD-10-CM

## 2019-10-05 DIAGNOSIS — I10 Essential (primary) hypertension: Secondary | ICD-10-CM | POA: Diagnosis not present

## 2019-10-05 DIAGNOSIS — E441 Mild protein-calorie malnutrition: Secondary | ICD-10-CM | POA: Diagnosis not present

## 2019-10-12 DIAGNOSIS — E782 Mixed hyperlipidemia: Secondary | ICD-10-CM | POA: Diagnosis not present

## 2019-10-12 DIAGNOSIS — I10 Essential (primary) hypertension: Secondary | ICD-10-CM | POA: Diagnosis not present

## 2019-10-12 DIAGNOSIS — I739 Peripheral vascular disease, unspecified: Secondary | ICD-10-CM | POA: Diagnosis not present

## 2020-01-05 DIAGNOSIS — H35373 Puckering of macula, bilateral: Secondary | ICD-10-CM | POA: Diagnosis not present

## 2020-01-05 DIAGNOSIS — H524 Presbyopia: Secondary | ICD-10-CM | POA: Diagnosis not present

## 2020-01-05 DIAGNOSIS — E782 Mixed hyperlipidemia: Secondary | ICD-10-CM | POA: Diagnosis not present

## 2020-01-05 DIAGNOSIS — H35033 Hypertensive retinopathy, bilateral: Secondary | ICD-10-CM | POA: Diagnosis not present

## 2020-01-05 DIAGNOSIS — Q141 Congenital malformation of retina: Secondary | ICD-10-CM | POA: Diagnosis not present

## 2020-01-05 DIAGNOSIS — I739 Peripheral vascular disease, unspecified: Secondary | ICD-10-CM | POA: Diagnosis not present

## 2020-01-05 DIAGNOSIS — H35013 Changes in retinal vascular appearance, bilateral: Secondary | ICD-10-CM | POA: Diagnosis not present

## 2020-01-05 DIAGNOSIS — I10 Essential (primary) hypertension: Secondary | ICD-10-CM | POA: Diagnosis not present

## 2020-01-12 DIAGNOSIS — I739 Peripheral vascular disease, unspecified: Secondary | ICD-10-CM | POA: Diagnosis not present

## 2020-01-12 DIAGNOSIS — I10 Essential (primary) hypertension: Secondary | ICD-10-CM | POA: Diagnosis not present

## 2020-01-12 DIAGNOSIS — G8194 Hemiplegia, unspecified affecting left nondominant side: Secondary | ICD-10-CM | POA: Diagnosis not present

## 2020-01-12 DIAGNOSIS — E782 Mixed hyperlipidemia: Secondary | ICD-10-CM | POA: Diagnosis not present

## 2020-01-12 DIAGNOSIS — I63411 Cerebral infarction due to embolism of right middle cerebral artery: Secondary | ICD-10-CM | POA: Diagnosis not present

## 2020-04-18 DIAGNOSIS — E782 Mixed hyperlipidemia: Secondary | ICD-10-CM | POA: Diagnosis not present

## 2020-04-18 DIAGNOSIS — I739 Peripheral vascular disease, unspecified: Secondary | ICD-10-CM | POA: Diagnosis not present

## 2020-04-18 DIAGNOSIS — Z0000 Encounter for general adult medical examination without abnormal findings: Secondary | ICD-10-CM | POA: Diagnosis not present

## 2020-04-18 DIAGNOSIS — Z23 Encounter for immunization: Secondary | ICD-10-CM | POA: Diagnosis not present

## 2020-04-18 DIAGNOSIS — I10 Essential (primary) hypertension: Secondary | ICD-10-CM | POA: Diagnosis not present

## 2020-04-25 ENCOUNTER — Other Ambulatory Visit: Payer: Self-pay | Admitting: Internal Medicine

## 2020-04-25 DIAGNOSIS — E782 Mixed hyperlipidemia: Secondary | ICD-10-CM | POA: Diagnosis not present

## 2020-04-25 DIAGNOSIS — I739 Peripheral vascular disease, unspecified: Secondary | ICD-10-CM | POA: Diagnosis not present

## 2020-04-25 DIAGNOSIS — I10 Essential (primary) hypertension: Secondary | ICD-10-CM | POA: Diagnosis not present

## 2020-04-25 DIAGNOSIS — D497 Neoplasm of unspecified behavior of endocrine glands and other parts of nervous system: Secondary | ICD-10-CM

## 2020-04-25 DIAGNOSIS — F17200 Nicotine dependence, unspecified, uncomplicated: Secondary | ICD-10-CM

## 2020-04-25 DIAGNOSIS — Z0000 Encounter for general adult medical examination without abnormal findings: Secondary | ICD-10-CM | POA: Diagnosis not present

## 2020-04-25 DIAGNOSIS — G8194 Hemiplegia, unspecified affecting left nondominant side: Secondary | ICD-10-CM | POA: Diagnosis not present

## 2020-05-03 ENCOUNTER — Ambulatory Visit (INDEPENDENT_AMBULATORY_CARE_PROVIDER_SITE_OTHER): Payer: Medicare HMO | Admitting: Physician Assistant

## 2020-05-03 ENCOUNTER — Other Ambulatory Visit: Payer: Self-pay

## 2020-05-03 ENCOUNTER — Ambulatory Visit: Payer: Medicare Other | Admitting: Vascular Surgery

## 2020-05-03 ENCOUNTER — Ambulatory Visit (HOSPITAL_COMMUNITY)
Admission: RE | Admit: 2020-05-03 | Discharge: 2020-05-03 | Disposition: A | Discharge status: Home / Self Care | Payer: Medicare HMO | Source: Ambulatory Visit | Attending: Physician Assistant | Admitting: Physician Assistant

## 2020-05-03 VITALS — BP 143/81 | HR 63 | Temp 98.6°F | Resp 20 | Ht 76.0 in | Wt 139.2 lb

## 2020-05-03 DIAGNOSIS — I739 Peripheral vascular disease, unspecified: Secondary | ICD-10-CM

## 2020-05-03 NOTE — Progress Notes (Signed)
Established Intermittent Claudication   History of Present Illness   Robert Mckay is a 69 y.o. ([birth year]) male who presents to go over studies related to PAD.  He is s/p R CIA stent by Dr. Oneida Alar 06/2019.  He has known BLE SFA occlusions.  He denies claudication, rest pain, or tissue changes of BLE.  He says his R foot feels cold from time to time but this does not bother him.  He is taking a statin daily.  He states he stopped taking his aspirin.  He is an occasional smoker.   Current Outpatient Medications  Medication Sig Dispense Refill  . amLODipine (NORVASC) 5 MG tablet Take 5 mg by mouth daily.   3  . lisinopril-hydrochlorothiazide (ZESTORETIC) 20-12.5 MG tablet Take 1 tablet by mouth daily.    . rosuvastatin (CRESTOR) 10 MG tablet Take 1 tablet (10 mg total) by mouth daily. 30 tablet 11  . aspirin EC 81 MG tablet Take 1 tablet (81 mg total) by mouth daily. (Patient not taking: Reported on 05/03/2020) 150 tablet 2  . Multiple Vitamin (MULTIVITAMIN WITH MINERALS) TABS tablet Take 1 tablet by mouth daily. One A Day Men's Multivitamin (Patient not taking: Reported on 05/03/2020)     No current facility-administered medications for this visit.    REVIEW OF SYSTEMS (negative unless checked):   Cardiac:  []  Chest pain or chest pressure? []  Shortness of breath upon activity? []  Shortness of breath when lying flat? []  Irregular heart rhythm?  Vascular:  []  Pain in calf, thigh, or hip brought on by walking? []  Pain in feet at night that wakes you up from your sleep? []  Blood clot in your veins? []  Leg swelling?  Pulmonary:  []  Oxygen at home? []  Productive cough? []  Wheezing?  Neurologic:  []  Sudden weakness in arms or legs? []  Sudden numbness in arms or legs? []  Sudden onset of difficult speaking or slurred speech? []  Temporary loss of vision in one eye? []  Problems with dizziness?  Gastrointestinal:  []  Blood in stool? []  Vomited blood?  Genitourinary:  []   Burning when urinating? []  Blood in urine?  Psychiatric:  []  Major depression  Hematologic:  []  Bleeding problems? []  Problems with blood clotting?  Dermatologic:  []  Rashes or ulcers?  Constitutional:  []  Fever or chills?  Ear/Nose/Throat:  []  Change in hearing? []  Nose bleeds? []  Sore throat?  Musculoskeletal:  []  Back pain? []  Joint pain? []  Muscle pain?   Physical Examination   Vitals:   05/03/20 1238  BP: (!) 143/81  Pulse: 63  Resp: 20  Temp: 98.6 F (37 C)  TempSrc: Temporal  SpO2: 100%  Weight: 139 lb 3.2 oz (63.1 kg)  Height: 6\' 4"  (1.93 m)   Body mass index is 16.94 kg/m.  General:  WDWN in NAD; vital signs documented above Gait: Not observed HENT: WNL, normocephalic Pulmonary: normal non-labored breathing , without Rales, rhonchi,  wheezing Cardiac: regular HR Abdomen: soft, NT, no masses Skin: without rashes Vascular Exam/Pulses:  Feet are symmetrical warm to touch; no palpable pedal pulses Extremities: without ischemic changes, without Gangrene , without cellulitis; without open wounds;  Musculoskeletal: no muscle wasting or atrophy  Neurologic: A&O X 3;  No focal weakness or paresthesias are detected Psychiatric:  The pt has Normal affect.  Non-Invasive Vascular imaging   ABI ABI/TBIToday's ABIToday's TBIPrevious ABIPrevious TBI  +-------+-----------+-----------+------------+------------+  Right 0.50    0.39    0.40    0.33      +-------+-----------+-----------+------------+------------+  Left  0.60    0.58    0.49    0.58        Medical Decision Making   Robert Mckay is a 69 y.o. male who presents for surveillance of PAD s/p R CIA stent in January   Patient denies any return of symptoms of claudication and occasional rest pain which were apparent prior to iliac stenting  Right ABI with only a small increase compared to preop however without symptoms no indication for  intervention  Recommended patient resume taking aspirin 81 mg daily in addition to his statin  Encouraged smoking cessation  Recheck ABIs and aortoiliac duplex in 1 year; call/return office sooner if symptoms develop   Dagoberto Ligas PA-C [facility] [place] Office: [phone]  Clinic MD: Dr. Oneida Alar

## 2020-05-11 ENCOUNTER — Other Ambulatory Visit: Payer: Self-pay

## 2020-05-11 ENCOUNTER — Ambulatory Visit
Admission: RE | Admit: 2020-05-11 | Discharge: 2020-05-11 | Disposition: A | Discharge status: Home / Self Care | Payer: Medicare HMO | Source: Ambulatory Visit | Attending: Internal Medicine | Admitting: Internal Medicine

## 2020-05-11 DIAGNOSIS — R911 Solitary pulmonary nodule: Secondary | ICD-10-CM | POA: Diagnosis not present

## 2020-05-11 DIAGNOSIS — J984 Other disorders of lung: Secondary | ICD-10-CM | POA: Diagnosis not present

## 2020-05-11 DIAGNOSIS — F17200 Nicotine dependence, unspecified, uncomplicated: Secondary | ICD-10-CM

## 2020-05-11 DIAGNOSIS — I700 Atherosclerosis of aorta: Secondary | ICD-10-CM | POA: Diagnosis not present

## 2020-05-14 ENCOUNTER — Ambulatory Visit
Admission: RE | Admit: 2020-05-14 | Discharge: 2020-05-14 | Disposition: A | Discharge status: Home / Self Care | Payer: Medicare HMO | Source: Ambulatory Visit | Attending: Internal Medicine | Admitting: Internal Medicine

## 2020-05-14 DIAGNOSIS — D497 Neoplasm of unspecified behavior of endocrine glands and other parts of nervous system: Secondary | ICD-10-CM

## 2020-05-14 DIAGNOSIS — D352 Benign neoplasm of pituitary gland: Secondary | ICD-10-CM | POA: Diagnosis not present

## 2020-05-14 MED ORDER — GADOBENATE DIMEGLUMINE 529 MG/ML IV SOLN
10.0000 mL | Freq: Once | INTRAVENOUS | Status: AC | PRN
  Administered 2020-05-14: 10 mL via INTRAVENOUS

## 2020-05-21 ENCOUNTER — Ambulatory Visit: Payer: Medicare HMO | Attending: Internal Medicine

## 2020-05-21 DIAGNOSIS — Z23 Encounter for immunization: Secondary | ICD-10-CM

## 2020-05-21 NOTE — Progress Notes (Signed)
   Covid-19 Vaccination Clinic  Name:  Robert Mckay    MRN: [id number] DOB: 1950/10/31  05/21/2020  Robert Mckay was observed post Covid-19 immunization for 15 minutes without incident. He was provided with Vaccine Information Sheet and instruction to access the V-Safe system.   Robert Mckay was instructed to call 911 with any severe reactions post vaccine: Marland Kitchen Difficulty breathing  . Swelling of face and throat  . A fast heartbeat  . A bad rash all over body  . Dizziness and weakness   Immunizations Administered    Name Date Dose VIS Date Route   Pfizer COVID-19 Vaccine 05/21/2020  5:25 PM 0.3 mL 04/11/2020 Intramuscular   Manufacturer: [place]   Lot: [id number]   [place]: [phone]

## 2020-05-22 DIAGNOSIS — F17200 Nicotine dependence, unspecified, uncomplicated: Secondary | ICD-10-CM | POA: Diagnosis not present

## 2020-05-22 DIAGNOSIS — I63411 Cerebral infarction due to embolism of right middle cerebral artery: Secondary | ICD-10-CM | POA: Diagnosis not present

## 2020-05-22 DIAGNOSIS — I10 Essential (primary) hypertension: Secondary | ICD-10-CM | POA: Diagnosis not present

## 2020-05-22 DIAGNOSIS — E782 Mixed hyperlipidemia: Secondary | ICD-10-CM | POA: Diagnosis not present

## 2020-05-22 DIAGNOSIS — Z681 Body mass index (BMI) 19 or less, adult: Secondary | ICD-10-CM | POA: Diagnosis not present

## 2020-05-22 DIAGNOSIS — D352 Benign neoplasm of pituitary gland: Secondary | ICD-10-CM | POA: Diagnosis not present

## 2020-06-01 DIAGNOSIS — I10 Essential (primary) hypertension: Secondary | ICD-10-CM | POA: Diagnosis not present

## 2020-06-01 DIAGNOSIS — D352 Benign neoplasm of pituitary gland: Secondary | ICD-10-CM | POA: Diagnosis not present

## 2020-07-18 ENCOUNTER — Other Ambulatory Visit: Payer: Self-pay | Admitting: Vascular Surgery

## 2020-08-08 DIAGNOSIS — E782 Mixed hyperlipidemia: Secondary | ICD-10-CM | POA: Diagnosis not present

## 2020-08-08 DIAGNOSIS — D497 Neoplasm of unspecified behavior of endocrine glands and other parts of nervous system: Secondary | ICD-10-CM | POA: Diagnosis not present

## 2020-08-08 DIAGNOSIS — I10 Essential (primary) hypertension: Secondary | ICD-10-CM | POA: Diagnosis not present

## 2020-08-08 DIAGNOSIS — G8194 Hemiplegia, unspecified affecting left nondominant side: Secondary | ICD-10-CM | POA: Diagnosis not present

## 2020-08-08 DIAGNOSIS — I739 Peripheral vascular disease, unspecified: Secondary | ICD-10-CM | POA: Diagnosis not present

## 2020-08-15 DIAGNOSIS — G8194 Hemiplegia, unspecified affecting left nondominant side: Secondary | ICD-10-CM | POA: Diagnosis not present

## 2020-08-15 DIAGNOSIS — I739 Peripheral vascular disease, unspecified: Secondary | ICD-10-CM | POA: Diagnosis not present

## 2020-08-15 DIAGNOSIS — E782 Mixed hyperlipidemia: Secondary | ICD-10-CM | POA: Diagnosis not present

## 2020-08-15 DIAGNOSIS — R7303 Prediabetes: Secondary | ICD-10-CM | POA: Diagnosis not present

## 2020-08-15 DIAGNOSIS — I10 Essential (primary) hypertension: Secondary | ICD-10-CM | POA: Diagnosis not present

## 2020-09-12 DIAGNOSIS — R2681 Unsteadiness on feet: Secondary | ICD-10-CM | POA: Diagnosis not present

## 2020-09-24 DIAGNOSIS — E782 Mixed hyperlipidemia: Secondary | ICD-10-CM | POA: Diagnosis not present

## 2020-09-24 DIAGNOSIS — I739 Peripheral vascular disease, unspecified: Secondary | ICD-10-CM | POA: Diagnosis not present

## 2020-09-24 DIAGNOSIS — N182 Chronic kidney disease, stage 2 (mild): Secondary | ICD-10-CM | POA: Diagnosis not present

## 2020-09-24 DIAGNOSIS — I10 Essential (primary) hypertension: Secondary | ICD-10-CM | POA: Diagnosis not present

## 2020-12-05 DIAGNOSIS — F17200 Nicotine dependence, unspecified, uncomplicated: Secondary | ICD-10-CM | POA: Diagnosis not present

## 2020-12-05 DIAGNOSIS — D352 Benign neoplasm of pituitary gland: Secondary | ICD-10-CM | POA: Diagnosis not present

## 2020-12-05 DIAGNOSIS — Z681 Body mass index (BMI) 19 or less, adult: Secondary | ICD-10-CM | POA: Diagnosis not present

## 2020-12-05 DIAGNOSIS — I63411 Cerebral infarction due to embolism of right middle cerebral artery: Secondary | ICD-10-CM | POA: Diagnosis not present

## 2020-12-05 DIAGNOSIS — I10 Essential (primary) hypertension: Secondary | ICD-10-CM | POA: Diagnosis not present

## 2020-12-05 DIAGNOSIS — E782 Mixed hyperlipidemia: Secondary | ICD-10-CM | POA: Diagnosis not present

## 2020-12-10 DIAGNOSIS — E782 Mixed hyperlipidemia: Secondary | ICD-10-CM | POA: Diagnosis not present

## 2020-12-10 DIAGNOSIS — D352 Benign neoplasm of pituitary gland: Secondary | ICD-10-CM | POA: Diagnosis not present

## 2020-12-10 DIAGNOSIS — I10 Essential (primary) hypertension: Secondary | ICD-10-CM | POA: Diagnosis not present

## 2020-12-17 DIAGNOSIS — N1831 Chronic kidney disease, stage 3a: Secondary | ICD-10-CM | POA: Diagnosis not present

## 2020-12-17 DIAGNOSIS — I10 Essential (primary) hypertension: Secondary | ICD-10-CM | POA: Diagnosis not present

## 2020-12-17 DIAGNOSIS — E782 Mixed hyperlipidemia: Secondary | ICD-10-CM | POA: Diagnosis not present

## 2020-12-17 DIAGNOSIS — I739 Peripheral vascular disease, unspecified: Secondary | ICD-10-CM | POA: Diagnosis not present

## 2020-12-20 ENCOUNTER — Other Ambulatory Visit: Payer: Self-pay

## 2020-12-20 ENCOUNTER — Encounter: Payer: Self-pay | Admitting: Podiatry

## 2020-12-20 ENCOUNTER — Ambulatory Visit: Payer: Medicare HMO | Admitting: Podiatry

## 2020-12-20 DIAGNOSIS — I999 Unspecified disorder of circulatory system: Secondary | ICD-10-CM | POA: Diagnosis not present

## 2020-12-20 DIAGNOSIS — B351 Tinea unguium: Secondary | ICD-10-CM

## 2020-12-20 DIAGNOSIS — M79674 Pain in right toe(s): Secondary | ICD-10-CM | POA: Diagnosis not present

## 2020-12-20 DIAGNOSIS — M79675 Pain in left toe(s): Secondary | ICD-10-CM | POA: Diagnosis not present

## 2020-12-20 NOTE — Progress Notes (Signed)
Subjective:   Patient ID: Robert Mckay, male   DOB: 70 y.o.   MRN: [id number]   HPI Patient presents with pain in all of his toenails and circulatory issues where he has had previous stents but has not been followed recently.  Patient still smokes quarter pack per day but has reduced and tries to stay active   Review of Systems  All other systems reviewed and are negative.      Objective:  Physical Exam Vitals and nursing note reviewed.  Constitutional:      Appearance: He is well-developed.  Pulmonary:     Effort: Pulmonary effort is normal.  Musculoskeletal:        General: Normal range of motion.  Skin:    General: Skin is warm.  Neurological:     Mental Status: He is alert.    Neurovascular status found to be diminished both PT DP pulses bilateral and sharp dull and vibratory bilateral.  Patient does have good digital perfusion I did not note any ulcerations break in tissue and has severely thickened nailbeds 1-5 both feet dystrophic and painful when palpated.  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  Gave him instructions on how to inspect his feet daily and reappoint as needed

## 2020-12-27 DIAGNOSIS — N1831 Chronic kidney disease, stage 3a: Secondary | ICD-10-CM | POA: Diagnosis not present

## 2020-12-27 DIAGNOSIS — N281 Cyst of kidney, acquired: Secondary | ICD-10-CM | POA: Diagnosis not present

## 2020-12-27 DIAGNOSIS — K7689 Other specified diseases of liver: Secondary | ICD-10-CM | POA: Diagnosis not present

## 2021-01-03 DIAGNOSIS — R69 Illness, unspecified: Secondary | ICD-10-CM | POA: Diagnosis not present

## 2021-03-22 ENCOUNTER — Other Ambulatory Visit: Payer: Self-pay

## 2021-03-22 ENCOUNTER — Encounter: Payer: Self-pay | Admitting: Podiatry

## 2021-03-22 ENCOUNTER — Ambulatory Visit: Payer: Medicare HMO | Admitting: Podiatry

## 2021-03-22 DIAGNOSIS — B351 Tinea unguium: Secondary | ICD-10-CM

## 2021-03-22 DIAGNOSIS — M79675 Pain in left toe(s): Secondary | ICD-10-CM | POA: Diagnosis not present

## 2021-03-22 DIAGNOSIS — M79674 Pain in right toe(s): Secondary | ICD-10-CM

## 2021-03-22 DIAGNOSIS — I999 Unspecified disorder of circulatory system: Secondary | ICD-10-CM

## 2021-03-22 NOTE — Progress Notes (Signed)
This patient returns to my office for at risk foot care.  This patient requires this care by a professional since this patient will be at risk due to having vascular disease.  This patient is unable to cut nails himself since the patient cannot reach his nails.These nails are painful walking and wearing shoes.  This patient presents for at risk foot care today.  General Appearance  Alert, conversant and in no acute stress.  Vascular  Dorsalis pedis and posterior tibial  pulses are  weakly palpable  bilaterally.  Capillary return is within normal limits  bilaterally. Temperature is within normal limits  bilaterally.  Neurologic  Senn-Weinstein monofilament wire test within normal limits  bilaterally. Muscle power within normal limits bilaterally.  Nails Thick disfigured discolored nails with subungual debris  from hallux to fifth toes bilaterally. No evidence of bacterial infection or drainage bilaterally.  Orthopedic  No limitations of motion  feet .  No crepitus or effusions noted.  No bony pathology or digital deformities noted.  Skin  normotropic skin with no porokeratosis noted bilaterally.  No signs of infections or ulcers noted.     Onychomycosis  Pain in right toes  Pain in left toes  Consent was obtained for treatment procedures.   Mechanical debridement of nails 1-5  bilaterally performed with a nail nipper.  Filed with dremel without incident.    Return office visit   3 months                  Told patient to return for periodic foot care and evaluation due to potential at risk complications.   Gardiner Barefoot DPM

## 2021-04-12 DIAGNOSIS — H2513 Age-related nuclear cataract, bilateral: Secondary | ICD-10-CM | POA: Diagnosis not present

## 2021-04-12 DIAGNOSIS — H35033 Hypertensive retinopathy, bilateral: Secondary | ICD-10-CM | POA: Diagnosis not present

## 2021-04-12 DIAGNOSIS — H04123 Dry eye syndrome of bilateral lacrimal glands: Secondary | ICD-10-CM | POA: Diagnosis not present

## 2021-04-12 DIAGNOSIS — H524 Presbyopia: Secondary | ICD-10-CM | POA: Diagnosis not present

## 2021-04-25 DIAGNOSIS — E782 Mixed hyperlipidemia: Secondary | ICD-10-CM | POA: Diagnosis not present

## 2021-04-25 DIAGNOSIS — Z0000 Encounter for general adult medical examination without abnormal findings: Secondary | ICD-10-CM | POA: Diagnosis not present

## 2021-04-25 DIAGNOSIS — I10 Essential (primary) hypertension: Secondary | ICD-10-CM | POA: Diagnosis not present

## 2021-04-25 DIAGNOSIS — I739 Peripheral vascular disease, unspecified: Secondary | ICD-10-CM | POA: Diagnosis not present

## 2021-04-25 DIAGNOSIS — N1831 Chronic kidney disease, stage 3a: Secondary | ICD-10-CM | POA: Diagnosis not present

## 2021-05-02 DIAGNOSIS — G8194 Hemiplegia, unspecified affecting left nondominant side: Secondary | ICD-10-CM | POA: Diagnosis not present

## 2021-05-02 DIAGNOSIS — I700 Atherosclerosis of aorta: Secondary | ICD-10-CM | POA: Diagnosis not present

## 2021-05-02 DIAGNOSIS — I10 Essential (primary) hypertension: Secondary | ICD-10-CM | POA: Diagnosis not present

## 2021-05-02 DIAGNOSIS — I739 Peripheral vascular disease, unspecified: Secondary | ICD-10-CM | POA: Diagnosis not present

## 2021-05-02 DIAGNOSIS — E782 Mixed hyperlipidemia: Secondary | ICD-10-CM | POA: Diagnosis not present

## 2021-05-02 DIAGNOSIS — N1831 Chronic kidney disease, stage 3a: Secondary | ICD-10-CM | POA: Diagnosis not present

## 2021-05-02 DIAGNOSIS — Z0000 Encounter for general adult medical examination without abnormal findings: Secondary | ICD-10-CM | POA: Diagnosis not present

## 2021-05-02 DIAGNOSIS — E441 Mild protein-calorie malnutrition: Secondary | ICD-10-CM | POA: Diagnosis not present

## 2021-06-26 ENCOUNTER — Other Ambulatory Visit: Payer: Self-pay

## 2021-06-26 ENCOUNTER — Ambulatory Visit: Payer: PPO | Admitting: Podiatry

## 2021-06-26 ENCOUNTER — Encounter: Payer: Self-pay | Admitting: Podiatry

## 2021-06-26 DIAGNOSIS — B351 Tinea unguium: Secondary | ICD-10-CM

## 2021-06-26 DIAGNOSIS — M79674 Pain in right toe(s): Secondary | ICD-10-CM | POA: Diagnosis not present

## 2021-06-26 DIAGNOSIS — I999 Unspecified disorder of circulatory system: Secondary | ICD-10-CM

## 2021-06-26 DIAGNOSIS — M79675 Pain in left toe(s): Secondary | ICD-10-CM

## 2021-06-26 NOTE — Progress Notes (Signed)
This patient returns to my office for at risk foot care.  This patient requires this care by a professional since this patient will be at risk due to having vascular disease.  This patient is unable to cut nails himself since the patient cannot reach his nails.These nails are painful walking and wearing shoes.  This patient presents for at risk foot care today.  General Appearance  Alert, conversant and in no acute stress.  Vascular  Dorsalis pedis and posterior tibial  pulses are  weakly palpable  bilaterally.  Capillary return is within normal limits  bilaterally. Temperature is within normal limits  bilaterally.  Neurologic  Senn-Weinstein monofilament wire test within normal limits  bilaterally. Muscle power within normal limits bilaterally.  Nails Thick disfigured discolored nails with subungual debris  from hallux to fifth toes bilaterally. No evidence of bacterial infection or drainage bilaterally.  Orthopedic  No limitations of motion  feet .  No crepitus or effusions noted.  No bony pathology or digital deformities noted.  Skin  normotropic skin with no porokeratosis noted bilaterally.  No signs of infections or ulcers noted.     Onychomycosis  Pain in right toes  Pain in left toes  Consent was obtained for treatment procedures.   Mechanical debridement of nails 1-5  bilaterally performed with a nail nipper.  Filed with dremel without incident.    Return office visit   3 months                  Told patient to return for periodic foot care and evaluation due to potential at risk complications.   Gardiner Barefoot DPM

## 2021-09-25 ENCOUNTER — Ambulatory Visit (INDEPENDENT_AMBULATORY_CARE_PROVIDER_SITE_OTHER): Payer: PPO | Admitting: Podiatry

## 2021-09-25 ENCOUNTER — Encounter: Payer: Self-pay | Admitting: Podiatry

## 2021-09-25 DIAGNOSIS — M79674 Pain in right toe(s): Secondary | ICD-10-CM

## 2021-09-25 DIAGNOSIS — I999 Unspecified disorder of circulatory system: Secondary | ICD-10-CM

## 2021-09-25 DIAGNOSIS — M79675 Pain in left toe(s): Secondary | ICD-10-CM | POA: Diagnosis not present

## 2021-09-25 DIAGNOSIS — B351 Tinea unguium: Secondary | ICD-10-CM

## 2021-09-25 NOTE — Progress Notes (Signed)
This patient returns to my office for at risk foot care.  This patient requires this care by a professional since this patient will be at risk due to having vascular disease.  This patient is unable to cut nails himself since the patient cannot reach his nails.These nails are painful walking and wearing shoes.  This patient presents for at risk foot care today.  General Appearance  Alert, conversant and in no acute stress.  Vascular  Dorsalis pedis and posterior tibial  pulses are  weakly palpable  bilaterally.  Capillary return is within normal limits  bilaterally. Temperature is within normal limits  bilaterally.  Neurologic  Senn-Weinstein monofilament wire test within normal limits  bilaterally. Muscle power within normal limits bilaterally.  Nails Thick disfigured discolored nails with subungual debris  from hallux to fifth toes bilaterally. No evidence of bacterial infection or drainage bilaterally.  Orthopedic  No limitations of motion  feet .  No crepitus or effusions noted.  No bony pathology or digital deformities noted.  Skin  normotropic skin with no porokeratosis noted bilaterally.  No signs of infections or ulcers noted.     Onychomycosis  Pain in right toes  Pain in left toes  Consent was obtained for treatment procedures.   Mechanical debridement of nails 1-5  bilaterally performed with a nail nipper.  Filed with dremel without incident.    Return office visit   10 weeks                 Told patient to return for periodic foot care and evaluation due to potential at risk complications.   Malyah Ohlrich DPM   

## 2021-10-24 DIAGNOSIS — I739 Peripheral vascular disease, unspecified: Secondary | ICD-10-CM | POA: Diagnosis not present

## 2021-10-24 DIAGNOSIS — E782 Mixed hyperlipidemia: Secondary | ICD-10-CM | POA: Diagnosis not present

## 2021-10-24 DIAGNOSIS — I700 Atherosclerosis of aorta: Secondary | ICD-10-CM | POA: Diagnosis not present

## 2021-10-24 DIAGNOSIS — N1831 Chronic kidney disease, stage 3a: Secondary | ICD-10-CM | POA: Diagnosis not present

## 2021-10-24 DIAGNOSIS — I10 Essential (primary) hypertension: Secondary | ICD-10-CM | POA: Diagnosis not present

## 2021-10-31 DIAGNOSIS — E782 Mixed hyperlipidemia: Secondary | ICD-10-CM | POA: Diagnosis not present

## 2021-10-31 DIAGNOSIS — N1831 Chronic kidney disease, stage 3a: Secondary | ICD-10-CM | POA: Diagnosis not present

## 2021-10-31 DIAGNOSIS — G8194 Hemiplegia, unspecified affecting left nondominant side: Secondary | ICD-10-CM | POA: Diagnosis not present

## 2021-10-31 DIAGNOSIS — I10 Essential (primary) hypertension: Secondary | ICD-10-CM | POA: Diagnosis not present

## 2021-10-31 DIAGNOSIS — I739 Peripheral vascular disease, unspecified: Secondary | ICD-10-CM | POA: Diagnosis not present

## 2021-10-31 DIAGNOSIS — E441 Mild protein-calorie malnutrition: Secondary | ICD-10-CM | POA: Diagnosis not present

## 2021-10-31 DIAGNOSIS — I700 Atherosclerosis of aorta: Secondary | ICD-10-CM | POA: Diagnosis not present

## 2021-12-04 ENCOUNTER — Ambulatory Visit: Payer: PPO | Admitting: Podiatry

## 2021-12-04 ENCOUNTER — Encounter: Payer: Self-pay | Admitting: Podiatry

## 2021-12-04 DIAGNOSIS — E782 Mixed hyperlipidemia: Secondary | ICD-10-CM | POA: Insufficient documentation

## 2021-12-04 DIAGNOSIS — G8194 Hemiplegia, unspecified affecting left nondominant side: Secondary | ICD-10-CM | POA: Insufficient documentation

## 2021-12-04 DIAGNOSIS — I10 Essential (primary) hypertension: Secondary | ICD-10-CM | POA: Insufficient documentation

## 2021-12-04 DIAGNOSIS — Z0000 Encounter for general adult medical examination without abnormal findings: Secondary | ICD-10-CM | POA: Insufficient documentation

## 2021-12-04 DIAGNOSIS — D497 Neoplasm of unspecified behavior of endocrine glands and other parts of nervous system: Secondary | ICD-10-CM | POA: Insufficient documentation

## 2021-12-04 DIAGNOSIS — I700 Atherosclerosis of aorta: Secondary | ICD-10-CM | POA: Insufficient documentation

## 2021-12-04 DIAGNOSIS — N182 Chronic kidney disease, stage 2 (mild): Secondary | ICD-10-CM

## 2021-12-04 DIAGNOSIS — I739 Peripheral vascular disease, unspecified: Secondary | ICD-10-CM | POA: Insufficient documentation

## 2021-12-04 DIAGNOSIS — E441 Mild protein-calorie malnutrition: Secondary | ICD-10-CM | POA: Insufficient documentation

## 2021-12-04 DIAGNOSIS — I999 Unspecified disorder of circulatory system: Secondary | ICD-10-CM

## 2021-12-04 DIAGNOSIS — R269 Unspecified abnormalities of gait and mobility: Secondary | ICD-10-CM | POA: Insufficient documentation

## 2021-12-04 DIAGNOSIS — M79674 Pain in right toe(s): Secondary | ICD-10-CM

## 2021-12-04 DIAGNOSIS — B351 Tinea unguium: Secondary | ICD-10-CM | POA: Diagnosis not present

## 2021-12-04 DIAGNOSIS — Z6841 Body Mass Index (BMI) 40.0 and over, adult: Secondary | ICD-10-CM | POA: Insufficient documentation

## 2021-12-04 DIAGNOSIS — N1831 Chronic kidney disease, stage 3a: Secondary | ICD-10-CM | POA: Insufficient documentation

## 2021-12-04 DIAGNOSIS — E789 Disorder of lipoprotein metabolism, unspecified: Secondary | ICD-10-CM | POA: Insufficient documentation

## 2021-12-04 DIAGNOSIS — Z8673 Personal history of transient ischemic attack (TIA), and cerebral infarction without residual deficits: Secondary | ICD-10-CM | POA: Insufficient documentation

## 2021-12-04 DIAGNOSIS — I63411 Cerebral infarction due to embolism of right middle cerebral artery: Secondary | ICD-10-CM | POA: Insufficient documentation

## 2021-12-04 DIAGNOSIS — M79675 Pain in left toe(s): Secondary | ICD-10-CM | POA: Diagnosis not present

## 2021-12-04 DIAGNOSIS — F17200 Nicotine dependence, unspecified, uncomplicated: Secondary | ICD-10-CM | POA: Insufficient documentation

## 2021-12-04 NOTE — Progress Notes (Signed)
This patient returns to my office for at risk foot care.  This patient requires this care by a professional since this patient will be at risk due to having vascular disease.  This patient is unable to cut nails himself since the patient cannot reach his nails.These nails are painful walking and wearing shoes.  This patient presents for at risk foot care today.  General Appearance  Alert, conversant and in no acute stress.  Vascular  Dorsalis pedis and posterior tibial  pulses are  weakly palpable  bilaterally.  Capillary return is within normal limits  bilaterally. Temperature is within normal limits  bilaterally.  Neurologic  Senn-Weinstein monofilament wire test within normal limits  bilaterally. Muscle power within normal limits bilaterally.  Nails Thick disfigured discolored nails with subungual debris  from hallux to fifth toes bilaterally. No evidence of bacterial infection or drainage bilaterally.  Orthopedic  No limitations of motion  feet .  No crepitus or effusions noted.  No bony pathology or digital deformities noted.  Skin  normotropic skin with no porokeratosis noted bilaterally.  No signs of infections or ulcers noted.     Onychomycosis  Pain in right toes  Pain in left toes  Consent was obtained for treatment procedures.   Mechanical debridement of nails 1-5  bilaterally performed with a nail nipper.  Filed with dremel without incident.    Return office visit   10 weeks                 Told patient to return for periodic foot care and evaluation due to potential at risk complications.   Izaya Netherton DPM   

## 2021-12-09 ENCOUNTER — Other Ambulatory Visit: Payer: Self-pay | Admitting: Home Modifications

## 2021-12-09 DIAGNOSIS — D497 Neoplasm of unspecified behavior of endocrine glands and other parts of nervous system: Secondary | ICD-10-CM | POA: Diagnosis not present

## 2021-12-09 DIAGNOSIS — I63411 Cerebral infarction due to embolism of right middle cerebral artery: Secondary | ICD-10-CM | POA: Diagnosis not present

## 2021-12-22 ENCOUNTER — Ambulatory Visit
Admission: RE | Admit: 2021-12-22 | Discharge: 2021-12-22 | Disposition: A | Discharge status: Home / Self Care | Payer: PPO | Source: Ambulatory Visit | Attending: Home Modifications | Admitting: Home Modifications

## 2021-12-22 DIAGNOSIS — D352 Benign neoplasm of pituitary gland: Secondary | ICD-10-CM | POA: Diagnosis not present

## 2021-12-22 DIAGNOSIS — D497 Neoplasm of unspecified behavior of endocrine glands and other parts of nervous system: Secondary | ICD-10-CM

## 2021-12-22 MED ORDER — GADOBENATE DIMEGLUMINE 529 MG/ML IV SOLN
8.0000 mL | Freq: Once | INTRAVENOUS | Status: AC | PRN
  Administered 2021-12-22: 8 mL via INTRAVENOUS

## 2022-02-15 IMAGING — MR MR HEAD WO/W CM
16 of 19 series · 36 of 48 positions shown · IV contrast (multihance)
Comparison: Head CT 03/25/2018.  Brain MRI 07/25/2014.

CLINICAL DATA: Pituitary tumor. Additional history provided by
technologist: Follow-up pituitary tumor, no prior surgery.

EXAM:
MRI HEAD WITHOUT AND WITH CONTRAST
TECHNIQUE: Multiplanar, multiecho pulse sequences of the brain and surrounding
structures were obtained without and with intravenous contrast.
CONTRAST:  10mL MULTIHANCE GADOBENATE DIMEGLUMINE 529 MG/ML IV SOLN

[Series 2: T1 · sagittal · 5.0mm · 0.45mm/px · 1 of 19 slices shown]
[im 1/19]
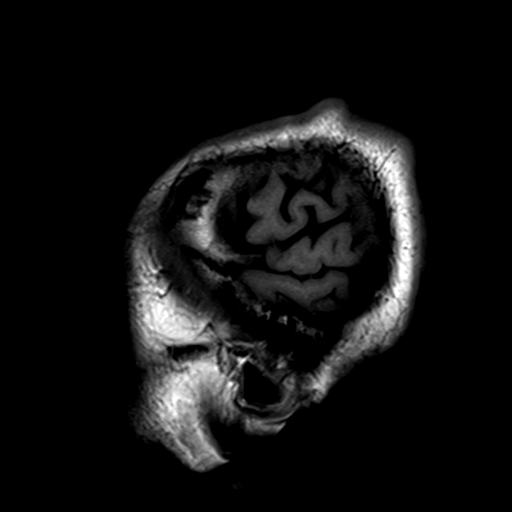

[Series 3: DWI · axial · 3.0mm · 1.80mm/px · z∈[-18,+127]mm · 11 of 100 slices shown]
[im 1/100]
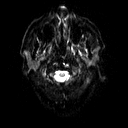
[im 10/100]
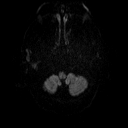
[im 20/100]
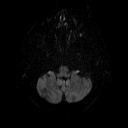
[im 30/100]
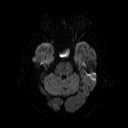
[im 40/100]
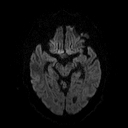
[im 50/100]
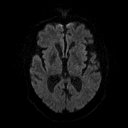
[im 60/100]
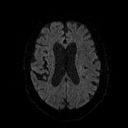
[im 70/100]
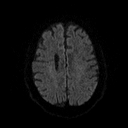
[im 80/100]
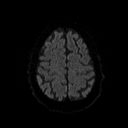
[im 90/100]
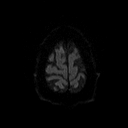
[im 100/100]
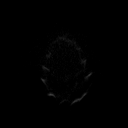

[Series 4: dwi_adc · axial · 3.0mm · 1.80mm/px · z∈[-18,+127]mm · 5 of 45 slices shown]
[im 1/45]
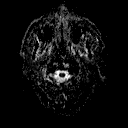
[im 12/45]
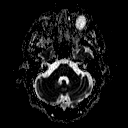
[im 23/45]
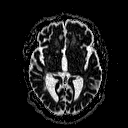
[im 34/45]
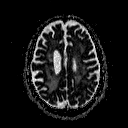
[im 45/45]
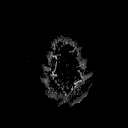

[Series 5: T2 · axial · 5.0mm · 0.36mm/px · z∈[-15,+120]mm · 2 of 22 slices shown]
[im 1/22]
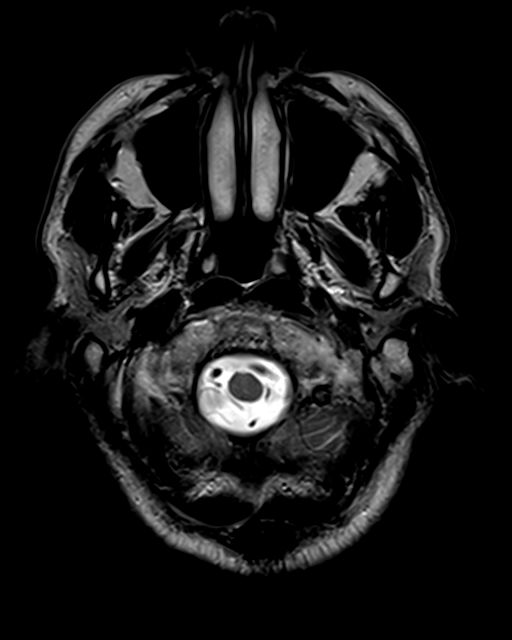
[im 22/22]
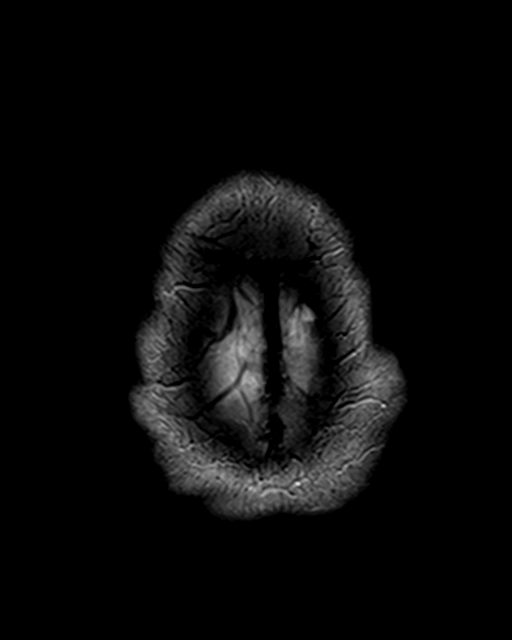

[Series 6: FLAIR · axial · 3.0mm · 0.45mm/px · z∈[-12,+121]mm · 3 of 30 slices shown]
[im 1/30]
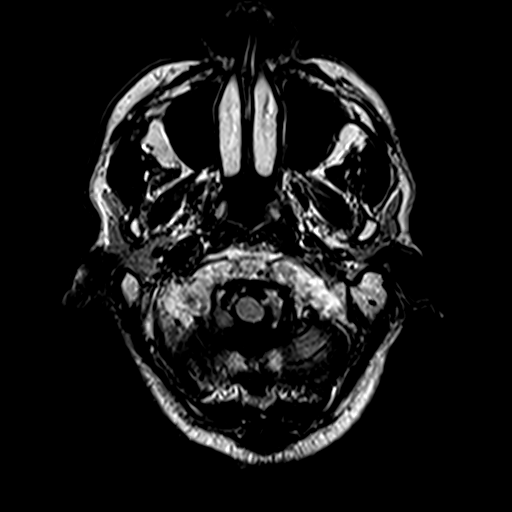
[im 15/30]
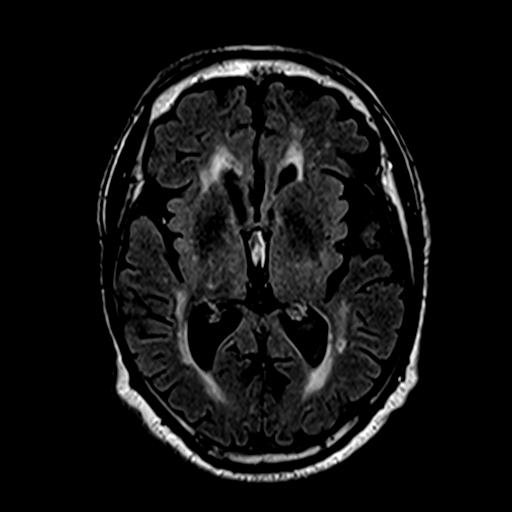
[im 30/30]
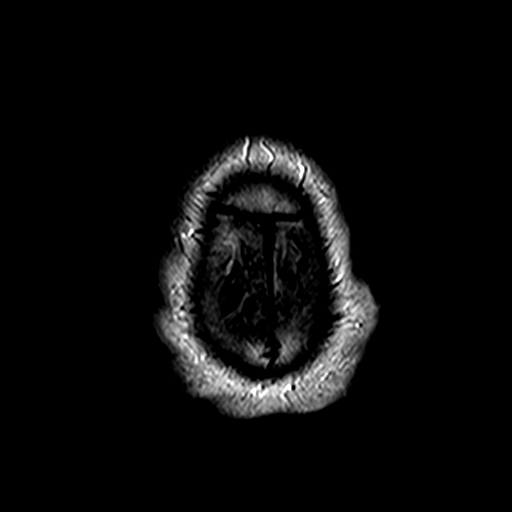

[Series 8: swi_images · axial · 4.0mm · 0.94mm/px · z∈[-14,+125]mm · 4 of 36 slices shown]
[im 1/36]
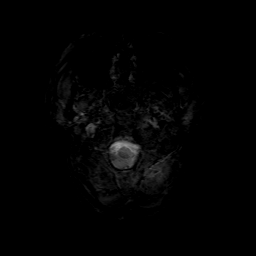
[im 12/36]
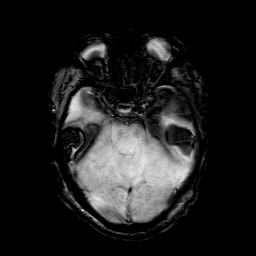
[im 24/36]
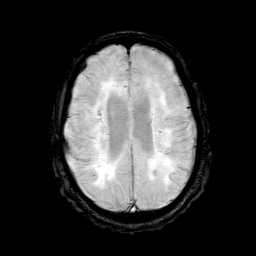
[im 36/36]
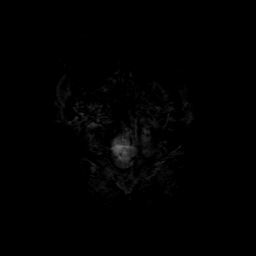

[Series 9: sag 3mm · sagittal · 3.0mm · 0.33mm/px · 1 of 11 slices shown]
[im 1/11]
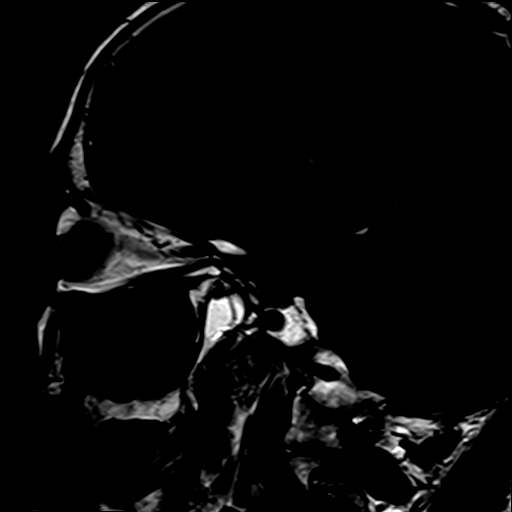

[Series 10: cor 3mm · coronal · 3.0mm · 0.33mm/px · 1 of 11 slices shown]
[im 1/11]
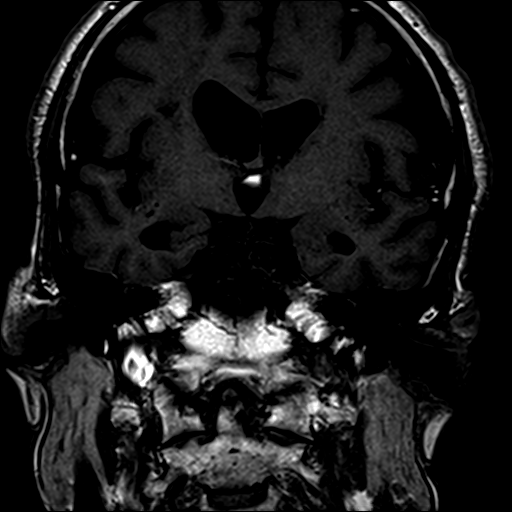

[Series 11: pre cor dynamic · coronal · non-contrast · 3.0mm · 0.35mm/px · 1 of 9 slices shown]
[im 1/9]
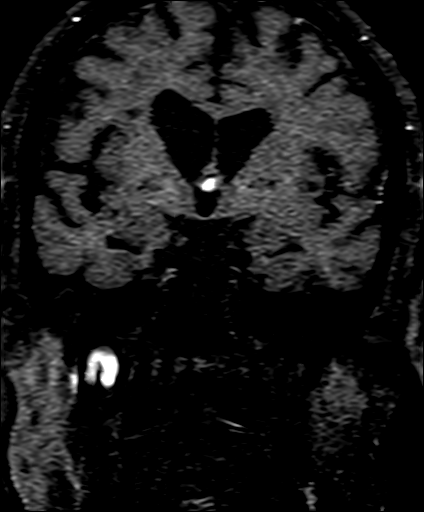

[Series 12: post fs cor · coronal · 3.0mm · 0.35mm/px · 1 of 9 slices shown (1 of 6)]
[im 1/9]
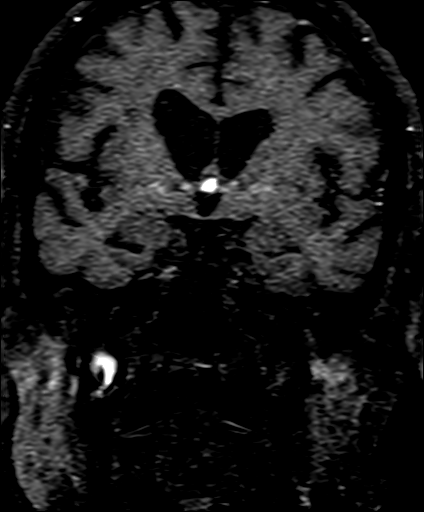

[Series 13: post fs cor · coronal · 3.0mm · 0.35mm/px · 1 of 9 slices shown (2 of 6)]
[im 1/9]
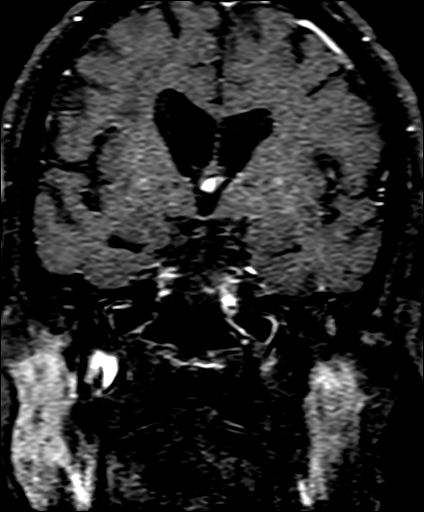

[Series 14: post fs cor · coronal · 3.0mm · 0.35mm/px · 1 of 9 slices shown (3 of 6)]
[im 1/9]
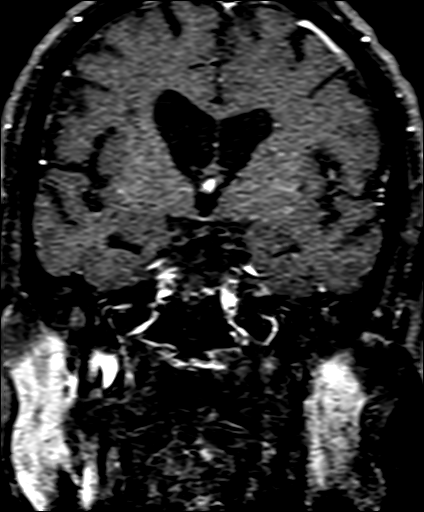

[Series 15: post fs cor · coronal · 3.0mm · 0.35mm/px · 1 of 9 slices shown (4 of 6)]
[im 1/9]
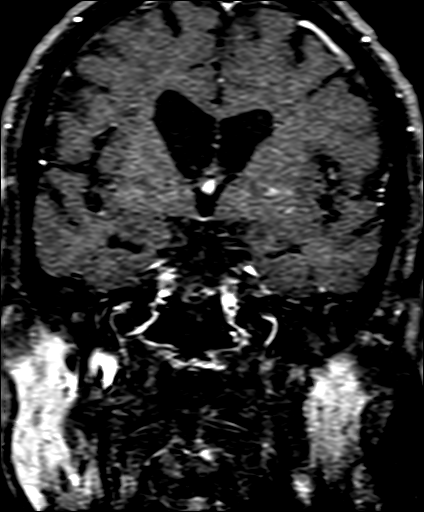

[Series 16: post fs cor · coronal · 3.0mm · 0.35mm/px · 1 of 9 slices shown (5 of 6)]
[im 1/9]
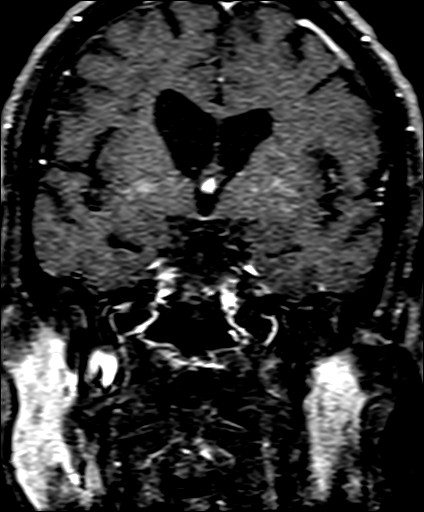

[Series 17: post fs cor · coronal · 3.0mm · 0.35mm/px · 1 of 9 slices shown (6 of 6)]
[im 1/9]
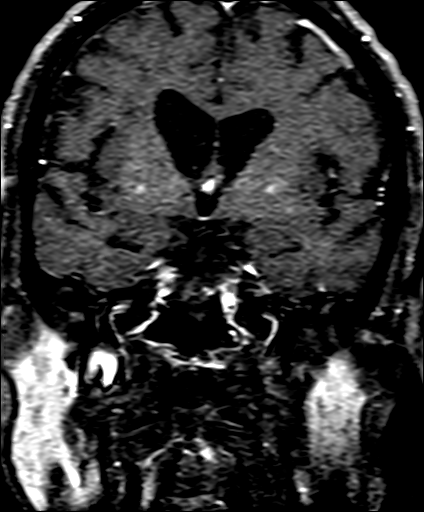

[Series 18: post sag 3mm · sagittal · 3.0mm · 0.33mm/px · 1 of 11 slices shown]
[im 1/11]
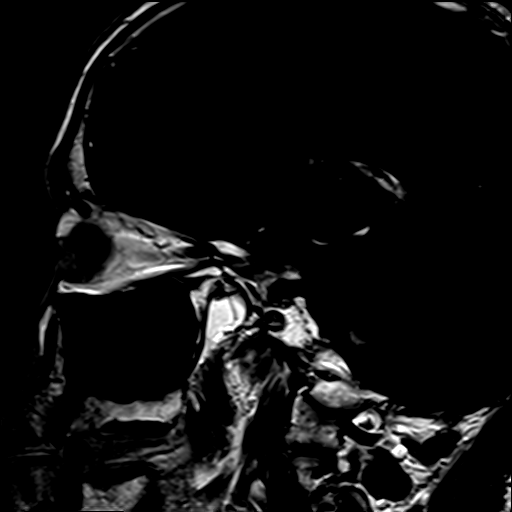

[36 of 48 positions shown; findings below may reference images not displayed]

FINDINGS: Brain:

Mild generalized cerebral atrophy.

Redemonstrated intrasellar soft tissue mass with suprasellar
extension. The mass is similar to minimally increased in size as
compared to the prior MRI of 07/25/2014, measuring 1.6 x 1.9 x
cm on the current examination (previously 1.6 x 1.9 x 2.0 cm,
remeasured on prior). As before, the mass demonstrates heterogeneous
post-contrast enhancement and may extend into the left cavernous
sinus (series 19, image 6). Mass effect upon the optic chiasm is
similar to subtly progressed. Unchanged rightward deviation of the
pituitary stalk.

Redemonstrated severe cerebral white matter chronic small vessel
ischemic disease. Unchanged chronic lacunar infarcts within the
right corona radiata and deep gray nuclei.

A chronic infarct within the right cerebellum is new as compared to
the prior MRI (series 5, images 3-5).

There are a few scattered supratentorial and infratentorial chronic
microhemorrhages, progressed.

There is no acute infarct.

No evidence of intracranial mass.

No extra-axial fluid collection.

No midline shift.

Vascular: Expected proximal arterial flow voids.

Skull and upper cervical spine: No focal marrow lesion.

Sinuses/Orbits: Visualized orbits show no acute finding. Chronic
medially displaced deformity of the left lamina papyracea. No
significant paranasal sinus disease at the imaged levels.
IMPRESSION: 1.6 x 1.9 x 2.2 cm sellar/suprasellar mass compatible with pituitary
macroadenoma, stable to minimally increased in size since the MRI of
07/25/2014. As before, the mass may extend into the left cavernous
sinus. Mass effect upon the optic chiasm is stable to slightly
progressed.

Redemonstrated mild cerebral atrophy and severe chronic small vessel
ischemic disease. Unchanged chronic lacunar infarcts within the
right corona and deep gray nuclei.

A chronic infarct within the right cerebellar hemisphere is new from
the prior MRI.

Progressive scattered supratentorial and infratentorial chronic
microhemorrhages, likely reflecting sequela of hypertensive
microangiopathy.

## 2022-02-21 ENCOUNTER — Ambulatory Visit: Payer: PPO | Admitting: Podiatry

## 2022-02-21 ENCOUNTER — Encounter: Payer: Self-pay | Admitting: Podiatry

## 2022-02-21 DIAGNOSIS — B351 Tinea unguium: Secondary | ICD-10-CM | POA: Diagnosis not present

## 2022-02-21 DIAGNOSIS — I999 Unspecified disorder of circulatory system: Secondary | ICD-10-CM

## 2022-02-21 DIAGNOSIS — M79674 Pain in right toe(s): Secondary | ICD-10-CM | POA: Diagnosis not present

## 2022-02-21 DIAGNOSIS — M79675 Pain in left toe(s): Secondary | ICD-10-CM

## 2022-02-21 DIAGNOSIS — N182 Chronic kidney disease, stage 2 (mild): Secondary | ICD-10-CM | POA: Diagnosis not present

## 2022-02-21 NOTE — Progress Notes (Signed)
This patient returns to my office for at risk foot care.  This patient requires this care by a professional since this patient will be at risk due to having vascular disease.  This patient is unable to cut nails himself since the patient cannot reach his nails.These nails are painful walking and wearing shoes.  This patient presents for at risk foot care today.  General Appearance  Alert, conversant and in no acute stress.  Vascular  Dorsalis pedis and posterior tibial  pulses are  weakly palpable  bilaterally.  Capillary return is within normal limits  bilaterally. Temperature is within normal limits  bilaterally.  Neurologic  Senn-Weinstein monofilament wire test within normal limits  bilaterally. Muscle power within normal limits bilaterally.  Nails Thick disfigured discolored nails with subungual debris  from hallux to fifth toes bilaterally. No evidence of bacterial infection or drainage bilaterally.  Orthopedic  No limitations of motion  feet .  No crepitus or effusions noted.  No bony pathology or digital deformities noted.  Skin  normotropic skin with no porokeratosis noted bilaterally.  No signs of infections or ulcers noted.     Onychomycosis  Pain in right toes  Pain in left toes  Consent was obtained for treatment procedures.   Mechanical debridement of nails 1-5  bilaterally performed with a nail nipper.  Filed with dremel without incident.    Return office visit   10 weeks                 Told patient to return for periodic foot care and evaluation due to potential at risk complications.   Gardiner Barefoot DPM

## 2022-04-22 DIAGNOSIS — H35033 Hypertensive retinopathy, bilateral: Secondary | ICD-10-CM | POA: Diagnosis not present

## 2022-04-22 DIAGNOSIS — H43813 Vitreous degeneration, bilateral: Secondary | ICD-10-CM | POA: Diagnosis not present

## 2022-04-22 DIAGNOSIS — H2513 Age-related nuclear cataract, bilateral: Secondary | ICD-10-CM | POA: Diagnosis not present

## 2022-04-22 DIAGNOSIS — H04123 Dry eye syndrome of bilateral lacrimal glands: Secondary | ICD-10-CM | POA: Diagnosis not present

## 2022-04-22 DIAGNOSIS — H524 Presbyopia: Secondary | ICD-10-CM | POA: Diagnosis not present

## 2022-05-02 ENCOUNTER — Ambulatory Visit: Payer: PPO | Admitting: Podiatry

## 2022-05-05 DIAGNOSIS — I700 Atherosclerosis of aorta: Secondary | ICD-10-CM | POA: Diagnosis not present

## 2022-05-05 DIAGNOSIS — I10 Essential (primary) hypertension: Secondary | ICD-10-CM | POA: Diagnosis not present

## 2022-05-05 DIAGNOSIS — E782 Mixed hyperlipidemia: Secondary | ICD-10-CM | POA: Diagnosis not present

## 2022-05-05 DIAGNOSIS — N1831 Chronic kidney disease, stage 3a: Secondary | ICD-10-CM | POA: Diagnosis not present

## 2022-05-05 DIAGNOSIS — E441 Mild protein-calorie malnutrition: Secondary | ICD-10-CM | POA: Diagnosis not present

## 2022-05-05 DIAGNOSIS — G8194 Hemiplegia, unspecified affecting left nondominant side: Secondary | ICD-10-CM | POA: Diagnosis not present

## 2022-05-05 DIAGNOSIS — Z0000 Encounter for general adult medical examination without abnormal findings: Secondary | ICD-10-CM | POA: Diagnosis not present

## 2022-05-05 DIAGNOSIS — I739 Peripheral vascular disease, unspecified: Secondary | ICD-10-CM | POA: Diagnosis not present

## 2022-09-01 DIAGNOSIS — I739 Peripheral vascular disease, unspecified: Secondary | ICD-10-CM | POA: Diagnosis not present

## 2022-09-01 DIAGNOSIS — N1831 Chronic kidney disease, stage 3a: Secondary | ICD-10-CM | POA: Diagnosis not present

## 2022-09-01 DIAGNOSIS — I700 Atherosclerosis of aorta: Secondary | ICD-10-CM | POA: Diagnosis not present

## 2022-09-01 DIAGNOSIS — G309 Alzheimer's disease, unspecified: Secondary | ICD-10-CM | POA: Diagnosis not present

## 2022-09-01 DIAGNOSIS — E782 Mixed hyperlipidemia: Secondary | ICD-10-CM | POA: Diagnosis not present

## 2022-09-01 DIAGNOSIS — G8194 Hemiplegia, unspecified affecting left nondominant side: Secondary | ICD-10-CM | POA: Diagnosis not present

## 2022-09-01 DIAGNOSIS — I10 Essential (primary) hypertension: Secondary | ICD-10-CM | POA: Diagnosis not present

## 2022-10-06 ENCOUNTER — Ambulatory Visit (INDEPENDENT_AMBULATORY_CARE_PROVIDER_SITE_OTHER): Payer: PPO | Admitting: Podiatry

## 2022-10-06 ENCOUNTER — Encounter: Payer: Self-pay | Admitting: Podiatry

## 2022-10-06 DIAGNOSIS — I999 Unspecified disorder of circulatory system: Secondary | ICD-10-CM

## 2022-10-06 DIAGNOSIS — M79674 Pain in right toe(s): Secondary | ICD-10-CM | POA: Diagnosis not present

## 2022-10-06 DIAGNOSIS — B351 Tinea unguium: Secondary | ICD-10-CM | POA: Diagnosis not present

## 2022-10-06 DIAGNOSIS — M79675 Pain in left toe(s): Secondary | ICD-10-CM

## 2022-10-06 NOTE — Progress Notes (Signed)
This patient returns to my office for at risk foot care.  This patient requires this care by a professional since this patient will be at risk due to having vascular disease.  This patient is unable to cut nails himself since the patient cannot reach his nails.These nails are painful walking and wearing shoes.  This patient presents for at risk foot care today.  General Appearance  Alert, conversant and in no acute stress.  Vascular  Dorsalis pedis and posterior tibial  pulses are  weakly palpable  bilaterally.  Capillary return is within normal limits  bilaterally. Temperature is within normal limits  bilaterally.  Neurologic  Senn-Weinstein monofilament wire test within normal limits  bilaterally. Muscle power within normal limits bilaterally.  Nails Thick disfigured discolored nails with subungual debris  from hallux to fifth toes bilaterally. No evidence of bacterial infection or drainage bilaterally.  Orthopedic  No limitations of motion  feet .  No crepitus or effusions noted.  No bony pathology or digital deformities noted.  Skin  normotropic skin with no porokeratosis noted bilaterally.  No signs of infections or ulcers noted.     Onychomycosis  Pain in right toes  Pain in left toes  Consent was obtained for treatment procedures.   Mechanical debridement of nails 1-5  bilaterally performed with a nail nipper.  Filed with dremel without incident.    Return office visit   4 months                 Told patient to return for periodic foot care and evaluation due to potential at risk complications.   Helane Gunther DPM

## 2022-12-10 DIAGNOSIS — D497 Neoplasm of unspecified behavior of endocrine glands and other parts of nervous system: Secondary | ICD-10-CM | POA: Diagnosis not present

## 2022-12-17 DIAGNOSIS — D497 Neoplasm of unspecified behavior of endocrine glands and other parts of nervous system: Secondary | ICD-10-CM | POA: Diagnosis not present

## 2022-12-29 DIAGNOSIS — E782 Mixed hyperlipidemia: Secondary | ICD-10-CM | POA: Diagnosis not present

## 2022-12-29 DIAGNOSIS — I10 Essential (primary) hypertension: Secondary | ICD-10-CM | POA: Diagnosis not present

## 2022-12-29 DIAGNOSIS — E441 Mild protein-calorie malnutrition: Secondary | ICD-10-CM | POA: Diagnosis not present

## 2022-12-29 DIAGNOSIS — I739 Peripheral vascular disease, unspecified: Secondary | ICD-10-CM | POA: Diagnosis not present

## 2022-12-29 DIAGNOSIS — G8194 Hemiplegia, unspecified affecting left nondominant side: Secondary | ICD-10-CM | POA: Diagnosis not present

## 2022-12-29 DIAGNOSIS — N1831 Chronic kidney disease, stage 3a: Secondary | ICD-10-CM | POA: Diagnosis not present

## 2022-12-29 DIAGNOSIS — I700 Atherosclerosis of aorta: Secondary | ICD-10-CM | POA: Diagnosis not present

## 2023-02-09 ENCOUNTER — Ambulatory Visit (INDEPENDENT_AMBULATORY_CARE_PROVIDER_SITE_OTHER): Payer: PPO | Admitting: Podiatry

## 2023-02-09 ENCOUNTER — Encounter: Payer: Self-pay | Admitting: Podiatry

## 2023-02-09 DIAGNOSIS — I739 Peripheral vascular disease, unspecified: Secondary | ICD-10-CM

## 2023-02-09 DIAGNOSIS — B351 Tinea unguium: Secondary | ICD-10-CM | POA: Diagnosis not present

## 2023-02-09 DIAGNOSIS — I999 Unspecified disorder of circulatory system: Secondary | ICD-10-CM

## 2023-02-09 DIAGNOSIS — M79675 Pain in left toe(s): Secondary | ICD-10-CM

## 2023-02-09 DIAGNOSIS — M79674 Pain in right toe(s): Secondary | ICD-10-CM

## 2023-02-09 NOTE — Progress Notes (Signed)
This patient returns to my office for at risk foot care.  This patient requires this care by a professional since this patient will be at risk due to having vascular disease.  This patient is unable to cut nails himself since the patient cannot reach his nails.These nails are painful walking and wearing shoes.  This patient presents for at risk foot care today.  General Appearance  Alert, conversant and in no acute stress.  Vascular  Dorsalis pedis and posterior tibial  pulses are  weakly palpable  bilaterally.  Capillary return is within normal limits  bilaterally. Temperature is within normal limits  bilaterally.  Neurologic  Senn-Weinstein monofilament wire test within normal limits  bilaterally. Muscle power within normal limits bilaterally.  Nails Thick disfigured discolored nails with subungual debris  from hallux to fifth toes bilaterally. No evidence of bacterial infection or drainage bilaterally.  Orthopedic  No limitations of motion  feet .  No crepitus or effusions noted.  No bony pathology or digital deformities noted.  Skin  normotropic skin with no porokeratosis noted bilaterally.  No signs of infections or ulcers noted.     Onychomycosis  Pain in right toes  Pain in left toes  Consent was obtained for treatment procedures.   Mechanical debridement of nails 1-5  bilaterally performed with a nail nipper.  Filed with dremel without incident.    Return office visit   3 months                  Told patient to return for periodic foot care and evaluation due to potential at risk complications.   Gardiner Barefoot DPM

## 2023-03-05 DIAGNOSIS — I739 Peripheral vascular disease, unspecified: Secondary | ICD-10-CM | POA: Diagnosis not present

## 2023-03-05 DIAGNOSIS — G8194 Hemiplegia, unspecified affecting left nondominant side: Secondary | ICD-10-CM | POA: Diagnosis not present

## 2023-03-05 DIAGNOSIS — E441 Mild protein-calorie malnutrition: Secondary | ICD-10-CM | POA: Diagnosis not present

## 2023-03-05 DIAGNOSIS — N1831 Chronic kidney disease, stage 3a: Secondary | ICD-10-CM | POA: Diagnosis not present

## 2023-03-05 DIAGNOSIS — I10 Essential (primary) hypertension: Secondary | ICD-10-CM | POA: Diagnosis not present

## 2023-03-05 DIAGNOSIS — I700 Atherosclerosis of aorta: Secondary | ICD-10-CM | POA: Diagnosis not present

## 2023-03-05 DIAGNOSIS — E782 Mixed hyperlipidemia: Secondary | ICD-10-CM | POA: Diagnosis not present

## 2023-05-04 DIAGNOSIS — E441 Mild protein-calorie malnutrition: Secondary | ICD-10-CM | POA: Diagnosis not present

## 2023-05-04 DIAGNOSIS — I739 Peripheral vascular disease, unspecified: Secondary | ICD-10-CM | POA: Diagnosis not present

## 2023-05-04 DIAGNOSIS — I700 Atherosclerosis of aorta: Secondary | ICD-10-CM | POA: Diagnosis not present

## 2023-05-04 DIAGNOSIS — G8194 Hemiplegia, unspecified affecting left nondominant side: Secondary | ICD-10-CM | POA: Diagnosis not present

## 2023-05-04 DIAGNOSIS — R5383 Other fatigue: Secondary | ICD-10-CM | POA: Diagnosis not present

## 2023-05-04 DIAGNOSIS — I10 Essential (primary) hypertension: Secondary | ICD-10-CM | POA: Diagnosis not present

## 2023-05-04 DIAGNOSIS — N1831 Chronic kidney disease, stage 3a: Secondary | ICD-10-CM | POA: Diagnosis not present

## 2023-05-04 DIAGNOSIS — E782 Mixed hyperlipidemia: Secondary | ICD-10-CM | POA: Diagnosis not present

## 2023-05-11 DIAGNOSIS — G309 Alzheimer's disease, unspecified: Secondary | ICD-10-CM | POA: Diagnosis not present

## 2023-05-11 DIAGNOSIS — I739 Peripheral vascular disease, unspecified: Secondary | ICD-10-CM | POA: Diagnosis not present

## 2023-05-11 DIAGNOSIS — N1831 Chronic kidney disease, stage 3a: Secondary | ICD-10-CM | POA: Diagnosis not present

## 2023-05-11 DIAGNOSIS — E441 Mild protein-calorie malnutrition: Secondary | ICD-10-CM | POA: Diagnosis not present

## 2023-05-11 DIAGNOSIS — I10 Essential (primary) hypertension: Secondary | ICD-10-CM | POA: Diagnosis not present

## 2023-05-11 DIAGNOSIS — I700 Atherosclerosis of aorta: Secondary | ICD-10-CM | POA: Diagnosis not present

## 2023-05-11 DIAGNOSIS — Z0000 Encounter for general adult medical examination without abnormal findings: Secondary | ICD-10-CM | POA: Diagnosis not present

## 2023-05-11 DIAGNOSIS — E782 Mixed hyperlipidemia: Secondary | ICD-10-CM | POA: Diagnosis not present

## 2023-05-11 DIAGNOSIS — G8194 Hemiplegia, unspecified affecting left nondominant side: Secondary | ICD-10-CM | POA: Diagnosis not present

## 2023-05-12 ENCOUNTER — Encounter: Payer: Self-pay | Admitting: Podiatry

## 2023-05-12 ENCOUNTER — Ambulatory Visit (INDEPENDENT_AMBULATORY_CARE_PROVIDER_SITE_OTHER): Payer: PPO | Admitting: Podiatry

## 2023-05-12 DIAGNOSIS — I739 Peripheral vascular disease, unspecified: Secondary | ICD-10-CM

## 2023-05-12 DIAGNOSIS — M79675 Pain in left toe(s): Secondary | ICD-10-CM

## 2023-05-12 DIAGNOSIS — B351 Tinea unguium: Secondary | ICD-10-CM | POA: Diagnosis not present

## 2023-05-12 DIAGNOSIS — M79674 Pain in right toe(s): Secondary | ICD-10-CM | POA: Diagnosis not present

## 2023-05-12 NOTE — Progress Notes (Signed)
This patient returns to my office for at risk foot care.  This patient requires this care by a professional since this patient will be at risk due to having vascular disease.  This patient is unable to cut nails himself since the patient cannot reach his nails.These nails are painful walking and wearing shoes.  This patient presents for at risk foot care today.  General Appearance  Alert, conversant and in no acute stress.  Vascular  Dorsalis pedis and posterior tibial  pulses are  weakly palpable  bilaterally.  Capillary return is within normal limits  bilaterally. Temperature is within normal limits  bilaterally.  Neurologic  Senn-Weinstein monofilament wire test within normal limits  bilaterally. Muscle power within normal limits bilaterally.  Nails Thick disfigured discolored nails with subungual debris  from hallux to fifth toes bilaterally. No evidence of bacterial infection or drainage bilaterally.  Orthopedic  No limitations of motion  feet .  No crepitus or effusions noted.  No bony pathology or digital deformities noted.  Skin  normotropic skin with no porokeratosis noted bilaterally.  No signs of infections or ulcers noted.     Onychomycosis  Pain in right toes  Pain in left toes  Consent was obtained for treatment procedures.   Mechanical debridement of nails 1-5  bilaterally performed with a nail nipper.  Filed with dremel without incident.    Return office visit   3 months                  Told patient to return for periodic foot care and evaluation due to potential at risk complications.   Gardiner Barefoot DPM

## 2023-07-09 DIAGNOSIS — H25813 Combined forms of age-related cataract, bilateral: Secondary | ICD-10-CM | POA: Diagnosis not present

## 2023-07-09 DIAGNOSIS — H43813 Vitreous degeneration, bilateral: Secondary | ICD-10-CM | POA: Diagnosis not present

## 2023-07-09 DIAGNOSIS — H04123 Dry eye syndrome of bilateral lacrimal glands: Secondary | ICD-10-CM | POA: Diagnosis not present

## 2023-07-09 DIAGNOSIS — H524 Presbyopia: Secondary | ICD-10-CM | POA: Diagnosis not present

## 2023-07-09 DIAGNOSIS — H35033 Hypertensive retinopathy, bilateral: Secondary | ICD-10-CM | POA: Diagnosis not present

## 2023-08-13 ENCOUNTER — Encounter: Payer: Self-pay | Admitting: Podiatry

## 2023-08-13 ENCOUNTER — Ambulatory Visit (INDEPENDENT_AMBULATORY_CARE_PROVIDER_SITE_OTHER): Payer: PPO | Admitting: Podiatry

## 2023-08-13 DIAGNOSIS — B351 Tinea unguium: Secondary | ICD-10-CM | POA: Diagnosis not present

## 2023-08-13 DIAGNOSIS — M79674 Pain in right toe(s): Secondary | ICD-10-CM | POA: Diagnosis not present

## 2023-08-13 DIAGNOSIS — M79675 Pain in left toe(s): Secondary | ICD-10-CM

## 2023-08-13 DIAGNOSIS — I739 Peripheral vascular disease, unspecified: Secondary | ICD-10-CM | POA: Diagnosis not present

## 2023-08-13 NOTE — Progress Notes (Signed)
This patient returns to my office for at risk foot care.  This patient requires this care by a professional since this patient will be at risk due to having vascular disease.  This patient is unable to cut nails himself since the patient cannot reach his nails.These nails are painful walking and wearing shoes.  This patient presents for at risk foot care today.  General Appearance  Alert, conversant and in no acute stress.  Vascular  Dorsalis pedis and posterior tibial  pulses are  weakly palpable  bilaterally.  Capillary return is within normal limits  bilaterally. Temperature is within normal limits  bilaterally.  Neurologic  Senn-Weinstein monofilament wire test within normal limits  bilaterally. Muscle power within normal limits bilaterally.  Nails Thick disfigured discolored nails with subungual debris  from hallux to fifth toes bilaterally. No evidence of bacterial infection or drainage bilaterally.  Orthopedic  No limitations of motion  feet .  No crepitus or effusions noted.  No bony pathology or digital deformities noted.  Skin  normotropic skin with no porokeratosis noted bilaterally.  No signs of infections or ulcers noted.     Onychomycosis  Pain in right toes  Pain in left toes  Consent was obtained for treatment procedures.   Mechanical debridement of nails 1-5  bilaterally performed with a nail nipper.  Filed with dremel without incident.    Return office visit   3 months                  Told patient to return for periodic foot care and evaluation due to potential at risk complications.   Gardiner Barefoot DPM

## 2023-11-12 ENCOUNTER — Ambulatory Visit: Payer: PPO | Admitting: Podiatry

## 2023-11-17 DIAGNOSIS — G309 Alzheimer's disease, unspecified: Secondary | ICD-10-CM | POA: Diagnosis not present

## 2023-11-17 DIAGNOSIS — N1831 Chronic kidney disease, stage 3a: Secondary | ICD-10-CM | POA: Diagnosis not present

## 2023-11-17 DIAGNOSIS — E441 Mild protein-calorie malnutrition: Secondary | ICD-10-CM | POA: Diagnosis not present

## 2023-11-17 DIAGNOSIS — I739 Peripheral vascular disease, unspecified: Secondary | ICD-10-CM | POA: Diagnosis not present

## 2023-11-17 DIAGNOSIS — G8194 Hemiplegia, unspecified affecting left nondominant side: Secondary | ICD-10-CM | POA: Diagnosis not present

## 2023-11-17 DIAGNOSIS — I10 Essential (primary) hypertension: Secondary | ICD-10-CM | POA: Diagnosis not present

## 2023-11-17 DIAGNOSIS — E782 Mixed hyperlipidemia: Secondary | ICD-10-CM | POA: Diagnosis not present

## 2023-11-17 DIAGNOSIS — I700 Atherosclerosis of aorta: Secondary | ICD-10-CM | POA: Diagnosis not present

## 2023-12-08 DIAGNOSIS — D497 Neoplasm of unspecified behavior of endocrine glands and other parts of nervous system: Secondary | ICD-10-CM | POA: Diagnosis not present

## 2023-12-15 DIAGNOSIS — D497 Neoplasm of unspecified behavior of endocrine glands and other parts of nervous system: Secondary | ICD-10-CM | POA: Diagnosis not present

## 2023-12-15 DIAGNOSIS — D352 Benign neoplasm of pituitary gland: Secondary | ICD-10-CM | POA: Diagnosis not present

## 2023-12-15 DIAGNOSIS — I10 Essential (primary) hypertension: Secondary | ICD-10-CM | POA: Diagnosis not present

## 2023-12-15 DIAGNOSIS — I700 Atherosclerosis of aorta: Secondary | ICD-10-CM | POA: Diagnosis not present

## 2023-12-15 DIAGNOSIS — N1831 Chronic kidney disease, stage 3a: Secondary | ICD-10-CM | POA: Diagnosis not present

## 2023-12-16 ENCOUNTER — Other Ambulatory Visit: Payer: Self-pay | Admitting: Nurse Practitioner

## 2023-12-16 DIAGNOSIS — D497 Neoplasm of unspecified behavior of endocrine glands and other parts of nervous system: Secondary | ICD-10-CM

## 2023-12-31 ENCOUNTER — Encounter: Payer: Self-pay | Admitting: Podiatry

## 2023-12-31 ENCOUNTER — Ambulatory Visit: Admitting: Podiatry

## 2023-12-31 DIAGNOSIS — M79675 Pain in left toe(s): Secondary | ICD-10-CM | POA: Diagnosis not present

## 2023-12-31 DIAGNOSIS — N182 Chronic kidney disease, stage 2 (mild): Secondary | ICD-10-CM | POA: Diagnosis not present

## 2023-12-31 DIAGNOSIS — I739 Peripheral vascular disease, unspecified: Secondary | ICD-10-CM

## 2023-12-31 DIAGNOSIS — M79674 Pain in right toe(s): Secondary | ICD-10-CM

## 2023-12-31 DIAGNOSIS — B351 Tinea unguium: Secondary | ICD-10-CM

## 2023-12-31 NOTE — Progress Notes (Signed)
This patient returns to my office for at risk foot care.  This patient requires this care by a professional since this patient will be at risk due to having vascular disease.  This patient is unable to cut nails himself since the patient cannot reach his nails.These nails are painful walking and wearing shoes.  This patient presents for at risk foot care today.  General Appearance  Alert, conversant and in no acute stress.  Vascular  Dorsalis pedis and posterior tibial  pulses are  weakly palpable  bilaterally.  Capillary return is within normal limits  bilaterally. Temperature is within normal limits  bilaterally.  Neurologic  Senn-Weinstein monofilament wire test within normal limits  bilaterally. Muscle power within normal limits bilaterally.  Nails Thick disfigured discolored nails with subungual debris  from hallux to fifth toes bilaterally. No evidence of bacterial infection or drainage bilaterally.  Orthopedic  No limitations of motion  feet .  No crepitus or effusions noted.  No bony pathology or digital deformities noted.  Skin  normotropic skin with no porokeratosis noted bilaterally.  No signs of infections or ulcers noted.     Onychomycosis  Pain in right toes  Pain in left toes  Consent was obtained for treatment procedures.   Mechanical debridement of nails 1-5  bilaterally performed with a nail nipper.  Filed with dremel without incident.    Return office visit   3 months                  Told patient to return for periodic foot care and evaluation due to potential at risk complications.   Gardiner Barefoot DPM

## 2024-03-31 ENCOUNTER — Ambulatory Visit: Admitting: Podiatry

## 2024-03-31 ENCOUNTER — Encounter: Payer: Self-pay | Admitting: Podiatry

## 2024-03-31 DIAGNOSIS — M79675 Pain in left toe(s): Secondary | ICD-10-CM | POA: Diagnosis not present

## 2024-03-31 DIAGNOSIS — M79674 Pain in right toe(s): Secondary | ICD-10-CM

## 2024-03-31 DIAGNOSIS — B351 Tinea unguium: Secondary | ICD-10-CM

## 2024-03-31 DIAGNOSIS — I739 Peripheral vascular disease, unspecified: Secondary | ICD-10-CM | POA: Diagnosis not present

## 2024-03-31 DIAGNOSIS — N182 Chronic kidney disease, stage 2 (mild): Secondary | ICD-10-CM | POA: Diagnosis not present

## 2024-03-31 NOTE — Progress Notes (Signed)
This patient returns to my office for at risk foot care.  This patient requires this care by a professional since this patient will be at risk due to having vascular disease.  This patient is unable to cut nails himself since the patient cannot reach his nails.These nails are painful walking and wearing shoes.  This patient presents for at risk foot care today.  General Appearance  Alert, conversant and in no acute stress.  Vascular  Dorsalis pedis and posterior tibial  pulses are  weakly palpable  bilaterally.  Capillary return is within normal limits  bilaterally. Temperature is within normal limits  bilaterally.  Neurologic  Senn-Weinstein monofilament wire test within normal limits  bilaterally. Muscle power within normal limits bilaterally.  Nails Thick disfigured discolored nails with subungual debris  from hallux to fifth toes bilaterally. No evidence of bacterial infection or drainage bilaterally.  Orthopedic  No limitations of motion  feet .  No crepitus or effusions noted.  No bony pathology or digital deformities noted.  Skin  normotropic skin with no porokeratosis noted bilaterally.  No signs of infections or ulcers noted.     Onychomycosis  Pain in right toes  Pain in left toes  Consent was obtained for treatment procedures.   Mechanical debridement of nails 1-5  bilaterally performed with a nail nipper.  Filed with dremel without incident.    Return office visit   3 months                  Told patient to return for periodic foot care and evaluation due to potential at risk complications.   Gardiner Barefoot DPM

## 2024-05-03 DIAGNOSIS — D497 Neoplasm of unspecified behavior of endocrine glands and other parts of nervous system: Secondary | ICD-10-CM | POA: Diagnosis not present

## 2024-05-24 DIAGNOSIS — G8194 Hemiplegia, unspecified affecting left nondominant side: Secondary | ICD-10-CM | POA: Diagnosis not present

## 2024-05-24 DIAGNOSIS — I10 Essential (primary) hypertension: Secondary | ICD-10-CM | POA: Diagnosis not present

## 2024-05-24 DIAGNOSIS — G309 Alzheimer's disease, unspecified: Secondary | ICD-10-CM | POA: Diagnosis not present

## 2024-05-24 DIAGNOSIS — Z0000 Encounter for general adult medical examination without abnormal findings: Secondary | ICD-10-CM | POA: Diagnosis not present

## 2024-05-24 DIAGNOSIS — R5383 Other fatigue: Secondary | ICD-10-CM | POA: Diagnosis not present

## 2024-05-24 DIAGNOSIS — I700 Atherosclerosis of aorta: Secondary | ICD-10-CM | POA: Diagnosis not present

## 2024-05-24 DIAGNOSIS — N1831 Chronic kidney disease, stage 3a: Secondary | ICD-10-CM | POA: Diagnosis not present

## 2024-05-24 DIAGNOSIS — I739 Peripheral vascular disease, unspecified: Secondary | ICD-10-CM | POA: Diagnosis not present

## 2024-05-24 DIAGNOSIS — E441 Mild protein-calorie malnutrition: Secondary | ICD-10-CM | POA: Diagnosis not present

## 2024-05-24 DIAGNOSIS — E782 Mixed hyperlipidemia: Secondary | ICD-10-CM | POA: Diagnosis not present

## 2024-07-04 ENCOUNTER — Ambulatory Visit: Admitting: Podiatry

## 2024-07-04 ENCOUNTER — Encounter: Payer: Self-pay | Admitting: Podiatry

## 2024-07-04 DIAGNOSIS — B351 Tinea unguium: Secondary | ICD-10-CM

## 2024-07-04 DIAGNOSIS — I739 Peripheral vascular disease, unspecified: Secondary | ICD-10-CM | POA: Diagnosis not present

## 2024-07-04 DIAGNOSIS — M79674 Pain in right toe(s): Secondary | ICD-10-CM

## 2024-07-04 DIAGNOSIS — N182 Chronic kidney disease, stage 2 (mild): Secondary | ICD-10-CM | POA: Diagnosis not present

## 2024-07-04 DIAGNOSIS — M79675 Pain in left toe(s): Secondary | ICD-10-CM

## 2024-07-04 NOTE — Progress Notes (Signed)
This patient returns to my office for at risk foot care.  This patient requires this care by a professional since this patient will be at risk due to having vascular disease.  This patient is unable to cut nails himself since the patient cannot reach his nails.These nails are painful walking and wearing shoes.  This patient presents for at risk foot care today.  General Appearance  Alert, conversant and in no acute stress.  Vascular  Dorsalis pedis and posterior tibial  pulses are  weakly palpable  bilaterally.  Capillary return is within normal limits  bilaterally. Temperature is within normal limits  bilaterally.  Neurologic  Senn-Weinstein monofilament wire test within normal limits  bilaterally. Muscle power within normal limits bilaterally.  Nails Thick disfigured discolored nails with subungual debris  from hallux to fifth toes bilaterally. No evidence of bacterial infection or drainage bilaterally.  Orthopedic  No limitations of motion  feet .  No crepitus or effusions noted.  No bony pathology or digital deformities noted.  Skin  normotropic skin with no porokeratosis noted bilaterally.  No signs of infections or ulcers noted.     Onychomycosis  Pain in right toes  Pain in left toes  Consent was obtained for treatment procedures.   Mechanical debridement of nails 1-5  bilaterally performed with a nail nipper.  Filed with dremel without incident.    Return office visit   3 months                  Told patient to return for periodic foot care and evaluation due to potential at risk complications.   Gardiner Barefoot DPM

## 2024-10-03 ENCOUNTER — Ambulatory Visit: Admitting: Podiatry
